# Patient Record
Sex: Female | Born: 1980 | Hispanic: No | Marital: Married | Smoking: Never smoker
Health system: Southern US, Community
[De-identification: ages and names within clinical notes are randomized; demographics above are authoritative.]

## PROBLEM LIST (undated history)

## (undated) DIAGNOSIS — I872 Venous insufficiency (chronic) (peripheral): Secondary | ICD-10-CM

## (undated) DIAGNOSIS — Z8614 Personal history of Methicillin resistant Staphylococcus aureus infection: Secondary | ICD-10-CM

## (undated) DIAGNOSIS — I8393 Asymptomatic varicose veins of bilateral lower extremities: Secondary | ICD-10-CM

## (undated) DIAGNOSIS — L639 Alopecia areata, unspecified: Secondary | ICD-10-CM

## (undated) DIAGNOSIS — Z789 Other specified health status: Secondary | ICD-10-CM

## (undated) HISTORY — DX: Personal history of Methicillin resistant Staphylococcus aureus infection: Z86.14

## (undated) HISTORY — PX: NO PAST SURGERIES: SHX2092

## (undated) HISTORY — DX: Other specified health status: Z78.9

## (undated) HISTORY — DX: Alopecia areata, unspecified: L63.9

## (undated) HISTORY — DX: Venous insufficiency (chronic) (peripheral): I87.2

## (undated) HISTORY — PX: DILATION AND CURETTAGE OF UTERUS: SHX78

## (undated) HISTORY — DX: Asymptomatic varicose veins of bilateral lower extremities: I83.93

---

## 2011-10-26 NOTE — L&D Delivery Note (Signed)
Delivery Note Excellent pushing effort noted, baby turn to OA after she labored in Knee chest position. Pudendal block with 20 cc Lidocaine 1% given. Tight vaginal band noted, hence left mediolateral episiotomy given. At 1:56 PM  viable and healthy FEMALE infant was delivered via Vaginal, Spontaneous Delivery (Presentation: Left Occiput Anterior).  APGAR: 9, 9; weight -pending Placenta status: Intact, Spontaneous.  Cord: 2 vessels with the following complications: None.  Cord pH: not neeed Anesthesia: Pudendal  Episiotomy: Left Mediolateral Lacerations: None Suture Repair: 3.0 vicryl rapide Est. Blood Loss (mL): 350  Mom to postpartum.  Baby to with mother.  Meral Geissinger R 08/18/2012, 2:56 PM

## 2012-01-11 LAB — OB RESULTS CONSOLE HEPATITIS B SURFACE ANTIGEN: Hepatitis B Surface Ag: NEGATIVE

## 2012-01-11 LAB — OB RESULTS CONSOLE RUBELLA ANTIBODY, IGM: Rubella: IMMUNE

## 2012-01-11 LAB — OB RESULTS CONSOLE ANTIBODY SCREEN: Antibody Screen: NEGATIVE

## 2012-01-11 LAB — OB RESULTS CONSOLE HIV ANTIBODY (ROUTINE TESTING): HIV: NONREACTIVE

## 2012-01-11 LAB — OB RESULTS CONSOLE RPR: RPR: NONREACTIVE

## 2012-01-11 LAB — OB RESULTS CONSOLE ABO/RH: RH Type: POSITIVE

## 2012-01-24 LAB — OB RESULTS CONSOLE GC/CHLAMYDIA
Chlamydia: NEGATIVE
Gonorrhea: NEGATIVE

## 2012-07-28 LAB — OB RESULTS CONSOLE GBS: GBS: NEGATIVE

## 2012-08-18 ENCOUNTER — Inpatient Hospital Stay (HOSPITAL_COMMUNITY)
Admission: AD | Admit: 2012-08-18 | Discharge: 2012-08-20 | DRG: 373 | Disposition: A | Discharge status: Home / Self Care | Payer: BC Managed Care – PPO | Source: Ambulatory Visit | Attending: Obstetrics and Gynecology | Admitting: Obstetrics and Gynecology

## 2012-08-18 ENCOUNTER — Encounter (HOSPITAL_COMMUNITY): Payer: Self-pay | Admitting: *Deleted

## 2012-08-18 LAB — CBC
HCT: 42.6 % (ref 36.0–46.0)
Hemoglobin: 14.3 g/dL (ref 12.0–15.0)
MCH: 30.6 pg (ref 26.0–34.0)
MCHC: 33.6 g/dL (ref 30.0–36.0)
MCV: 91.2 fL (ref 78.0–100.0)
Platelets: 148 10*3/uL — ABNORMAL LOW (ref 150–400)
RBC: 4.67 MIL/uL (ref 3.87–5.11)
RDW: 13.6 % (ref 11.5–15.5)
WBC: 10 10*3/uL (ref 4.0–10.5)

## 2012-08-18 LAB — TYPE AND SCREEN
ABO/RH(D): O POS
Antibody Screen: NEGATIVE

## 2012-08-18 LAB — ABO/RH: ABO/RH(D): O POS

## 2012-08-18 LAB — RPR: RPR Ser Ql: NONREACTIVE

## 2012-08-18 MED ORDER — OXYTOCIN BOLUS FROM INFUSION
500.0000 mL | INTRAVENOUS | Status: DC
  Administered 2012-08-18: 500 mL via INTRAVENOUS
  Filled 2012-08-18 (×64): qty 500

## 2012-08-18 MED ORDER — ONDANSETRON HCL 4 MG/2ML IJ SOLN: 4.0000 mg | Freq: Four times a day (QID) | INTRAMUSCULAR | Status: DC | PRN

## 2012-08-18 MED ORDER — PHENYLEPHRINE 40 MCG/ML (10ML) SYRINGE FOR IV PUSH (FOR BLOOD PRESSURE SUPPORT): 80.0000 ug | PREFILLED_SYRINGE | INTRAVENOUS | Status: DC | PRN

## 2012-08-18 MED ORDER — DIPHENHYDRAMINE HCL 25 MG PO CAPS: 25.0000 mg | ORAL_CAPSULE | Freq: Four times a day (QID) | ORAL | Status: DC | PRN

## 2012-08-18 MED ORDER — IBUPROFEN 600 MG PO TABS
600.0000 mg | ORAL_TABLET | Freq: Four times a day (QID) | ORAL | Status: DC | PRN
  Filled 2012-08-18: qty 1

## 2012-08-18 MED ORDER — DIBUCAINE 1 % RE OINT: 1.0000 "application " | TOPICAL_OINTMENT | RECTAL | Status: DC | PRN

## 2012-08-18 MED ORDER — LIDOCAINE HCL (PF) 1 % IJ SOLN
30.0000 mL | INTRAMUSCULAR | Status: DC | PRN
  Administered 2012-08-18: 30 mL via SUBCUTANEOUS
  Filled 2012-08-18: qty 30

## 2012-08-18 MED ORDER — SENNOSIDES-DOCUSATE SODIUM 8.6-50 MG PO TABS
2.0000 | ORAL_TABLET | Freq: Every day | ORAL | Status: DC
  Administered 2012-08-18 – 2012-08-19 (×2): 2 via ORAL

## 2012-08-18 MED ORDER — LACTATED RINGERS IV SOLN: 500.0000 mL | INTRAVENOUS | Status: DC | PRN

## 2012-08-18 MED ORDER — LACTATED RINGERS IV SOLN: 500.0000 mL | Freq: Once | INTRAVENOUS | Status: DC

## 2012-08-18 MED ORDER — BENZOCAINE-MENTHOL 20-0.5 % EX AERO
1.0000 "application " | INHALATION_SPRAY | CUTANEOUS | Status: DC | PRN
  Administered 2012-08-19: 1 via TOPICAL
  Filled 2012-08-18: qty 56

## 2012-08-18 MED ORDER — FLEET ENEMA 7-19 GM/118ML RE ENEM: 1.0000 | ENEMA | RECTAL | Status: DC | PRN

## 2012-08-18 MED ORDER — FENTANYL 2.5 MCG/ML BUPIVACAINE 1/10 % EPIDURAL INFUSION (WH - ANES): 14.0000 mL/h | INTRAMUSCULAR | Status: DC

## 2012-08-18 MED ORDER — ONDANSETRON HCL 4 MG/2ML IJ SOLN: 4.0000 mg | INTRAMUSCULAR | Status: DC | PRN

## 2012-08-18 MED ORDER — SIMETHICONE 80 MG PO CHEW: 80.0000 mg | CHEWABLE_TABLET | ORAL | Status: DC | PRN

## 2012-08-18 MED ORDER — ACETAMINOPHEN 325 MG PO TABS: 650.0000 mg | ORAL_TABLET | ORAL | Status: DC | PRN

## 2012-08-18 MED ORDER — OXYCODONE-ACETAMINOPHEN 5-325 MG PO TABS
1.0000 | ORAL_TABLET | ORAL | Status: DC | PRN
  Administered 2012-08-18 – 2012-08-19 (×2): 1 via ORAL
  Filled 2012-08-18 (×2): qty 1

## 2012-08-18 MED ORDER — LACTATED RINGERS IV SOLN
INTRAVENOUS | Status: DC
  Administered 2012-08-18: 1000 mL via INTRAVENOUS

## 2012-08-18 MED ORDER — TETANUS-DIPHTH-ACELL PERTUSSIS 5-2.5-18.5 LF-MCG/0.5 IM SUSP: 0.5000 mL | Freq: Once | INTRAMUSCULAR | Status: DC

## 2012-08-18 MED ORDER — OXYCODONE-ACETAMINOPHEN 5-325 MG PO TABS: 1.0000 | ORAL_TABLET | ORAL | Status: DC | PRN

## 2012-08-18 MED ORDER — WITCH HAZEL-GLYCERIN EX PADS: 1.0000 "application " | MEDICATED_PAD | CUTANEOUS | Status: DC | PRN

## 2012-08-18 MED ORDER — CITRIC ACID-SODIUM CITRATE 334-500 MG/5ML PO SOLN: 30.0000 mL | ORAL | Status: DC | PRN

## 2012-08-18 MED ORDER — EPHEDRINE 5 MG/ML INJ: 10.0000 mg | INTRAVENOUS | Status: DC | PRN

## 2012-08-18 MED ORDER — ZOLPIDEM TARTRATE 5 MG PO TABS: 5.0000 mg | ORAL_TABLET | Freq: Every evening | ORAL | Status: DC | PRN

## 2012-08-18 MED ORDER — DIPHENHYDRAMINE HCL 50 MG/ML IJ SOLN: 12.5000 mg | INTRAMUSCULAR | Status: DC | PRN

## 2012-08-18 MED ORDER — BUTORPHANOL TARTRATE 1 MG/ML IJ SOLN
1.0000 mg | INTRAMUSCULAR | Status: DC | PRN
  Administered 2012-08-18 (×2): 1 mg via INTRAVENOUS
  Filled 2012-08-18 (×2): qty 1

## 2012-08-18 MED ORDER — PRENATAL MULTIVITAMIN CH
1.0000 | ORAL_TABLET | Freq: Every day | ORAL | Status: DC
  Administered 2012-08-19: 1 via ORAL
  Filled 2012-08-18: qty 1

## 2012-08-18 MED ORDER — OXYTOCIN 40 UNITS IN LACTATED RINGERS INFUSION - SIMPLE MED
62.5000 mL/h | INTRAVENOUS | Status: DC
  Filled 2012-08-18: qty 1000

## 2012-08-18 MED ORDER — IBUPROFEN 600 MG PO TABS
600.0000 mg | ORAL_TABLET | Freq: Four times a day (QID) | ORAL | Status: DC
  Administered 2012-08-18 – 2012-08-19 (×4): 600 mg via ORAL
  Filled 2012-08-18 (×5): qty 1

## 2012-08-18 MED ORDER — ONDANSETRON HCL 4 MG PO TABS: 4.0000 mg | ORAL_TABLET | ORAL | Status: DC | PRN

## 2012-08-18 MED ORDER — LANOLIN HYDROUS EX OINT: TOPICAL_OINTMENT | CUTANEOUS | Status: DC | PRN

## 2012-08-18 NOTE — Progress Notes (Signed)
Samantha Trevino is a 31 y.o. G1P0 at [redacted]w[redacted]d by LMP admitted for active labor  Subjective: Contractions  Objective: BP 134/81  Pulse 82  Temp 98.2 F (36.8 C) (Oral)  Resp 18  Ht 5\' 3"  (1.6 m)  Wt 64.184 kg (141 lb 8 oz)  BMI 25.07 kg/m2  SpO2 100%      FHT:  FHR: 155 bpm, variability: moderate,  accelerations:  Present,  decelerations:  Absent UC:   regular, every 2-4 minutes SVE:   Dilation: 4.5 Effacement (%): 80 Station: -3 Exam by:: M.Topp,RN  Labs: Lab Results  Component Value Date   WBC 10.0 08/18/2012   HGB 14.3 08/18/2012   HCT 42.6 08/18/2012   MCV 91.2 08/18/2012   PLT 148* 08/18/2012    Assessment / Plan: Spontaneous labor, progressing normally  Labor: Progressing normally Preeclampsia:  na Fetal Wellbeing:  Category I Pain Control:  Labor support without medications I/D:  n/a Anticipated MOD:  NSVD  Page Pucciarelli J 08/18/2012, 7:37 AM

## 2012-08-18 NOTE — MAU Note (Signed)
Pt states started leaking clear fluid at 0130

## 2012-08-18 NOTE — H&P (Signed)
Samantha Trevino, Samantha Trevino                ACCOUNT NO.:  [id number]  MEDICAL RECORD NO.:  [id number]  LOCATION:  9167                          FACILITY:  [facility]  PHYSICIAN:  Lenoard Aden, M.D.DATE OF BIRTH:  January 29, 1981  DATE OF ADMISSION:  08/18/2012 DATE OF DISCHARGE:                             HISTORY & PHYSICAL   CHIEF COMPLAINT:  Labor.  She is a 32 year old [place] female, G1, P0 at 34 and 5/7 weeks who presented with questionable leakage of fluid and contractions with cervical change.  ALLERGIES:  She has no known drug allergies.  MEDICATIONS:  Prenatal vitamins.  SOCIAL HISTORY:  She denies being a nonsmoker, nondrinker.  She denies domestic or physical violence.  FAMILY HISTORY:  Family history of heart disease, sleep disorder __________ pregnancy.  PHYSICAL EXAMINATION:  GENERAL:  This is a well-developed, well- nourished [place] female, in no acute distress. HEENT:  Normal. NECK:  Supple.  Full range of motion. LUNGS:  Clear. HEART:  Regular rhythm. ABDOMEN:  Soft, gravid, nontender.  Estimated fetal weight 7.5 pounds. Cervix per RN is 4-5 cm, 100%, vertex, -2. EXTREMITIES:  There are no cords. NEUROLOGIC:  Nonfocal. SKIN:  Intact.  IMPRESSION:  Term intrauterine pregnancy in active labor.  PLAN:  Admit with precautions of attempts at vaginal delivery.     Lenoard Aden, M.D.    RJT/MEDQ  D:  08/18/2012  T:  08/18/2012  Job:  [id number]

## 2012-08-18 NOTE — MAU Note (Signed)
Dr Billy Coast notified of pt status and FHT tracing-to recheck in one hour for cervical change

## 2012-08-19 ENCOUNTER — Encounter (HOSPITAL_COMMUNITY): Payer: Self-pay | Admitting: *Deleted

## 2012-08-19 LAB — CBC
HCT: 32 % — ABNORMAL LOW (ref 36.0–46.0)
Hemoglobin: 11 g/dL — ABNORMAL LOW (ref 12.0–15.0)
MCH: 31.2 pg (ref 26.0–34.0)
MCHC: 34.4 g/dL (ref 30.0–36.0)
MCV: 90.7 fL (ref 78.0–100.0)
Platelets: 133 10*3/uL — ABNORMAL LOW (ref 150–400)
RBC: 3.53 MIL/uL — ABNORMAL LOW (ref 3.87–5.11)
RDW: 13.4 % (ref 11.5–15.5)
WBC: 13 10*3/uL — ABNORMAL HIGH (ref 4.0–10.5)

## 2012-08-19 MED ORDER — ACETAMINOPHEN 325 MG PO TABS
325.0000 mg | ORAL_TABLET | ORAL | Status: DC | PRN
  Administered 2012-08-20: 650 mg via ORAL
  Filled 2012-08-19: qty 2

## 2012-08-19 NOTE — Progress Notes (Signed)
PPD 1 SVD  S:  Reports feeling well - tired             Tolerating po/ No nausea or vomiting             Bleeding is light             Pain controlled with motrin only             Up ad lib / ambulatory  Newborn breast feeding  - no colostrum yet/ Circumcision planned tomorow   O:               VS: BP 106/71  Pulse 90  Temp 97.7 F (36.5 C) (Oral)  Resp 16  Ht 5\' 3"  (1.6 m)  Wt 64.184 kg (141 lb 8 oz)  BMI 25.07 kg/m2  SpO2 99%  Breastfeeding? Unknown   LABS:  Basename 08/19/12 0550 08/18/12 0525  WBC 13.0* 10.0  HGB 11.0* 14.3  PLT 133* 148*                                          I&O:                         I/O last 3 completed shifts: In: -  Out: 350 [Blood:350]               Physical Exam:             Alert and oriented X3  Lungs: Clear and unlabored  Heart: regular rate and rhythm / no mumurs  Abdomen: soft, non-tender, non-distended              Fundus: firm, non-tender, U-2  Perineum: no edema  Lochia: light  Extremities: no edema, no calf pain or tenderness    A: PPD # 1   Doing well - stable status  P:  Routine post partum orders  Discharge tomorrow - work with lactation today.              Recommend push water hydration / get sleep / start pumping to stimulate between feedings                                       Attempt breastfeed newborn every 2-4 hours  Marlinda Mike CNM, MSN 08/19/2012, 12:16 PM

## 2012-08-20 MED ORDER — PRENATAL 27-0.8 MG PO TABS: 1.0000 | ORAL_TABLET | Freq: Every day | ORAL | Status: DC

## 2012-08-20 MED ORDER — IBUPROFEN 600 MG PO TABS: 600.0000 mg | ORAL_TABLET | Freq: Four times a day (QID) | ORAL | Status: DC

## 2012-08-20 NOTE — Discharge Summary (Signed)
Obstetric Discharge Summary  Reason for Admission: onset of labor Prenatal Procedures: none Intrapartum Procedures: spontaneous vaginal delivery and episiotomy mediolateral 2nd Postpartum Procedures: none Complications-Operative and Postpartum: none Hemoglobin  Date Value Range Status  08/19/2012 11.0* 12.0 - 15.0 g/dL Final     REPEATED TO VERIFY     DELTA CHECK NOTED     HCT  Date Value Range Status  08/19/2012 32.0* 36.0 - 46.0 % Final    Physical Exam:  General: alert, cooperative and no distress Lochia: appropriate Uterine Fundus: firm Incision: healing well DVT Evaluation: No evidence of DVT seen on physical exam.  Discharge Diagnoses: Term Pregnancy-delivered  Discharge Information: Date: 08/20/2012 Activity: pelvic rest Diet: routine Medications: PNV and Ibuprofen Condition: stable Instructions: refer to practice specific booklet Discharge to: home Follow-up Information    Follow up with Lenoard Aden, MD. Schedule an appointment as soon as possible for a visit in 6 weeks.   Contact information:   [address] [place] [address] [phone]          Newborn Data: Live born female  Birth Weight: 7 lb 4 oz (3289 g) APGAR: 9, 9  Home with mother.  Samantha Trevino 08/20/2012, 7:28 AM

## 2012-08-20 NOTE — Discharge Summary (Signed)
Reviewed and agree with note and plan. V.Riyanna Crutchley, MD  

## 2012-08-20 NOTE — Progress Notes (Signed)
PPD 2 SVD  S:  Reports feeling well             Tolerating po/ No nausea or vomiting             Bleeding is light             Pain controlled with motrin             Up ad lib / ambulatory  Newborn breast & bottle feeding  / lactation consult yesterday - will see again today prior to discharge   O:               VS: BP 111/76  Pulse 88  Temp 97.9 F (36.6 C) (Oral)  Resp 18  Ht 5\' 3"  (1.6 m)  Wt 64.184 kg (141 lb 8 oz)  BMI 25.07 kg/m2  SpO2 99%  Breastfeeding? Unknown   LABS:  Basename 08/19/12 0550 08/18/12 0525  WBC 13.0* 10.0  HGB 11.0* 14.3  PLT 133* 148*                Physical Exam:             Alert and oriented X3  Lungs: Clear and unlabored  Heart: regular rate   Abdomen: soft, non-tender, non-distended              Fundus: firm, non-tender, U-2  Perineum: no edema  Lochia: light  Extremities: no edema, no calf pain or tenderness    A: PPD # 2   Doing well - stable status  P:  Routine post partum orders  Discharge home  Marlinda Mike CNM, MSN 08/20/2012, 7:14 AM

## 2012-09-05 NOTE — Progress Notes (Signed)
Post-discharge UR chart review completed. 

## 2014-08-26 ENCOUNTER — Encounter (HOSPITAL_COMMUNITY): Payer: Self-pay | Admitting: *Deleted

## 2017-06-16 DIAGNOSIS — I8311 Varicose veins of right lower extremity with inflammation: Secondary | ICD-10-CM | POA: Diagnosis not present

## 2017-06-16 DIAGNOSIS — I8312 Varicose veins of left lower extremity with inflammation: Secondary | ICD-10-CM | POA: Diagnosis not present

## 2017-06-16 DIAGNOSIS — I83893 Varicose veins of bilateral lower extremities with other complications: Secondary | ICD-10-CM | POA: Diagnosis not present

## 2017-06-28 DIAGNOSIS — I8311 Varicose veins of right lower extremity with inflammation: Secondary | ICD-10-CM | POA: Diagnosis not present

## 2017-06-28 DIAGNOSIS — I8312 Varicose veins of left lower extremity with inflammation: Secondary | ICD-10-CM | POA: Diagnosis not present

## 2017-07-11 DIAGNOSIS — I8312 Varicose veins of left lower extremity with inflammation: Secondary | ICD-10-CM | POA: Diagnosis not present

## 2017-07-11 DIAGNOSIS — I8311 Varicose veins of right lower extremity with inflammation: Secondary | ICD-10-CM | POA: Diagnosis not present

## 2017-07-11 DIAGNOSIS — I83893 Varicose veins of bilateral lower extremities with other complications: Secondary | ICD-10-CM | POA: Diagnosis not present

## 2017-08-15 DIAGNOSIS — J209 Acute bronchitis, unspecified: Secondary | ICD-10-CM | POA: Diagnosis not present

## 2017-08-15 DIAGNOSIS — R509 Fever, unspecified: Secondary | ICD-10-CM | POA: Diagnosis not present

## 2017-08-29 DIAGNOSIS — R05 Cough: Secondary | ICD-10-CM | POA: Diagnosis not present

## 2017-11-01 DIAGNOSIS — I8312 Varicose veins of left lower extremity with inflammation: Secondary | ICD-10-CM | POA: Diagnosis not present

## 2017-11-04 DIAGNOSIS — I8312 Varicose veins of left lower extremity with inflammation: Secondary | ICD-10-CM | POA: Diagnosis not present

## 2017-11-04 DIAGNOSIS — I83892 Varicose veins of left lower extremities with other complications: Secondary | ICD-10-CM | POA: Diagnosis not present

## 2017-11-18 DIAGNOSIS — I83892 Varicose veins of left lower extremities with other complications: Secondary | ICD-10-CM | POA: Diagnosis not present

## 2017-11-18 DIAGNOSIS — M7981 Nontraumatic hematoma of soft tissue: Secondary | ICD-10-CM | POA: Diagnosis not present

## 2017-11-18 DIAGNOSIS — I8312 Varicose veins of left lower extremity with inflammation: Secondary | ICD-10-CM | POA: Diagnosis not present

## 2017-12-01 DIAGNOSIS — M7981 Nontraumatic hematoma of soft tissue: Secondary | ICD-10-CM | POA: Diagnosis not present

## 2017-12-01 DIAGNOSIS — I8312 Varicose veins of left lower extremity with inflammation: Secondary | ICD-10-CM | POA: Diagnosis not present

## 2017-12-28 DIAGNOSIS — I8311 Varicose veins of right lower extremity with inflammation: Secondary | ICD-10-CM | POA: Diagnosis not present

## 2017-12-30 DIAGNOSIS — I8311 Varicose veins of right lower extremity with inflammation: Secondary | ICD-10-CM | POA: Diagnosis not present

## 2018-01-02 DIAGNOSIS — I8311 Varicose veins of right lower extremity with inflammation: Secondary | ICD-10-CM | POA: Diagnosis not present

## 2018-03-03 DIAGNOSIS — I8311 Varicose veins of right lower extremity with inflammation: Secondary | ICD-10-CM | POA: Diagnosis not present

## 2018-03-03 DIAGNOSIS — I83811 Varicose veins of right lower extremities with pain: Secondary | ICD-10-CM | POA: Diagnosis not present

## 2018-03-07 DIAGNOSIS — I8312 Varicose veins of left lower extremity with inflammation: Secondary | ICD-10-CM | POA: Diagnosis not present

## 2018-03-14 DIAGNOSIS — I8312 Varicose veins of left lower extremity with inflammation: Secondary | ICD-10-CM | POA: Diagnosis not present

## 2018-03-14 DIAGNOSIS — I83812 Varicose veins of left lower extremities with pain: Secondary | ICD-10-CM | POA: Diagnosis not present

## 2018-03-16 DIAGNOSIS — I8312 Varicose veins of left lower extremity with inflammation: Secondary | ICD-10-CM | POA: Diagnosis not present

## 2018-03-24 DIAGNOSIS — I8312 Varicose veins of left lower extremity with inflammation: Secondary | ICD-10-CM | POA: Diagnosis not present

## 2018-07-04 DIAGNOSIS — Z01419 Encounter for gynecological examination (general) (routine) without abnormal findings: Secondary | ICD-10-CM | POA: Diagnosis not present

## 2018-07-04 DIAGNOSIS — Z681 Body mass index (BMI) 19 or less, adult: Secondary | ICD-10-CM | POA: Diagnosis not present

## 2018-08-03 DIAGNOSIS — Z3201 Encounter for pregnancy test, result positive: Secondary | ICD-10-CM | POA: Diagnosis not present

## 2018-08-21 DIAGNOSIS — Z3201 Encounter for pregnancy test, result positive: Secondary | ICD-10-CM | POA: Diagnosis not present

## 2018-08-29 DIAGNOSIS — O021 Missed abortion: Secondary | ICD-10-CM | POA: Diagnosis not present

## 2018-09-01 DIAGNOSIS — O021 Missed abortion: Secondary | ICD-10-CM | POA: Diagnosis not present

## 2018-10-11 DIAGNOSIS — Z862 Personal history of diseases of the blood and blood-forming organs and certain disorders involving the immune mechanism: Secondary | ICD-10-CM | POA: Diagnosis not present

## 2018-10-11 DIAGNOSIS — L659 Nonscarring hair loss, unspecified: Secondary | ICD-10-CM | POA: Diagnosis not present

## 2018-10-11 DIAGNOSIS — E559 Vitamin D deficiency, unspecified: Secondary | ICD-10-CM | POA: Diagnosis not present

## 2018-10-11 DIAGNOSIS — Z0000 Encounter for general adult medical examination without abnormal findings: Secondary | ICD-10-CM | POA: Diagnosis not present

## 2018-11-07 DIAGNOSIS — L638 Other alopecia areata: Secondary | ICD-10-CM | POA: Diagnosis not present

## 2018-12-14 DIAGNOSIS — L638 Other alopecia areata: Secondary | ICD-10-CM | POA: Diagnosis not present

## 2019-01-09 DIAGNOSIS — L638 Other alopecia areata: Secondary | ICD-10-CM | POA: Diagnosis not present

## 2019-02-19 DIAGNOSIS — L638 Other alopecia areata: Secondary | ICD-10-CM | POA: Diagnosis not present

## 2019-03-27 DIAGNOSIS — L638 Other alopecia areata: Secondary | ICD-10-CM | POA: Diagnosis not present

## 2019-05-22 DIAGNOSIS — L811 Chloasma: Secondary | ICD-10-CM | POA: Diagnosis not present

## 2019-05-22 DIAGNOSIS — L638 Other alopecia areata: Secondary | ICD-10-CM | POA: Diagnosis not present

## 2019-07-17 DIAGNOSIS — Z681 Body mass index (BMI) 19 or less, adult: Secondary | ICD-10-CM | POA: Diagnosis not present

## 2019-07-17 DIAGNOSIS — L638 Other alopecia areata: Secondary | ICD-10-CM | POA: Diagnosis not present

## 2019-07-17 DIAGNOSIS — R87610 Atypical squamous cells of undetermined significance on cytologic smear of cervix (ASC-US): Secondary | ICD-10-CM | POA: Diagnosis not present

## 2019-07-17 DIAGNOSIS — Z01419 Encounter for gynecological examination (general) (routine) without abnormal findings: Secondary | ICD-10-CM | POA: Diagnosis not present

## 2019-07-17 DIAGNOSIS — L659 Nonscarring hair loss, unspecified: Secondary | ICD-10-CM | POA: Diagnosis not present

## 2019-10-27 DIAGNOSIS — Z20828 Contact with and (suspected) exposure to other viral communicable diseases: Secondary | ICD-10-CM | POA: Diagnosis not present

## 2019-10-27 DIAGNOSIS — Z03818 Encounter for observation for suspected exposure to other biological agents ruled out: Secondary | ICD-10-CM | POA: Diagnosis not present

## 2019-11-02 DIAGNOSIS — Z20828 Contact with and (suspected) exposure to other viral communicable diseases: Secondary | ICD-10-CM | POA: Diagnosis not present

## 2019-11-04 DIAGNOSIS — Z20828 Contact with and (suspected) exposure to other viral communicable diseases: Secondary | ICD-10-CM | POA: Diagnosis not present

## 2019-11-09 DIAGNOSIS — Z20828 Contact with and (suspected) exposure to other viral communicable diseases: Secondary | ICD-10-CM | POA: Diagnosis not present

## 2019-11-15 DIAGNOSIS — Z20828 Contact with and (suspected) exposure to other viral communicable diseases: Secondary | ICD-10-CM | POA: Diagnosis not present

## 2019-11-23 DIAGNOSIS — Z20828 Contact with and (suspected) exposure to other viral communicable diseases: Secondary | ICD-10-CM | POA: Diagnosis not present

## 2019-11-30 DIAGNOSIS — Z20828 Contact with and (suspected) exposure to other viral communicable diseases: Secondary | ICD-10-CM | POA: Diagnosis not present

## 2019-12-06 DIAGNOSIS — Z20828 Contact with and (suspected) exposure to other viral communicable diseases: Secondary | ICD-10-CM | POA: Diagnosis not present

## 2019-12-11 DIAGNOSIS — L811 Chloasma: Secondary | ICD-10-CM | POA: Diagnosis not present

## 2019-12-11 DIAGNOSIS — L638 Other alopecia areata: Secondary | ICD-10-CM | POA: Diagnosis not present

## 2019-12-18 DIAGNOSIS — Z20828 Contact with and (suspected) exposure to other viral communicable diseases: Secondary | ICD-10-CM | POA: Diagnosis not present

## 2019-12-27 DIAGNOSIS — Z3009 Encounter for other general counseling and advice on contraception: Secondary | ICD-10-CM | POA: Diagnosis not present

## 2020-01-11 DIAGNOSIS — Z3009 Encounter for other general counseling and advice on contraception: Secondary | ICD-10-CM | POA: Diagnosis not present

## 2020-02-08 DIAGNOSIS — Z3009 Encounter for other general counseling and advice on contraception: Secondary | ICD-10-CM | POA: Diagnosis not present

## 2020-02-25 DIAGNOSIS — H5213 Myopia, bilateral: Secondary | ICD-10-CM | POA: Diagnosis not present

## 2020-02-29 DIAGNOSIS — N979 Female infertility, unspecified: Secondary | ICD-10-CM | POA: Diagnosis not present

## 2020-03-17 DIAGNOSIS — Z0100 Encounter for examination of eyes and vision without abnormal findings: Secondary | ICD-10-CM | POA: Diagnosis not present

## 2020-03-31 DIAGNOSIS — N979 Female infertility, unspecified: Secondary | ICD-10-CM | POA: Diagnosis not present

## 2020-05-26 DIAGNOSIS — O0901 Supervision of pregnancy with history of infertility, first trimester: Secondary | ICD-10-CM | POA: Diagnosis not present

## 2020-05-26 DIAGNOSIS — Z3200 Encounter for pregnancy test, result unknown: Secondary | ICD-10-CM | POA: Diagnosis not present

## 2020-05-26 DIAGNOSIS — Z3689 Encounter for other specified antenatal screening: Secondary | ICD-10-CM | POA: Diagnosis not present

## 2020-05-26 LAB — OB RESULTS CONSOLE ABO/RH: RH Type: POSITIVE

## 2020-05-26 LAB — OB RESULTS CONSOLE RPR: RPR: NONREACTIVE

## 2020-05-26 LAB — OB RESULTS CONSOLE HEPATITIS B SURFACE ANTIGEN: Hepatitis B Surface Ag: NEGATIVE

## 2020-05-26 LAB — OB RESULTS CONSOLE RUBELLA ANTIBODY, IGM: Rubella: IMMUNE

## 2020-05-26 LAB — OB RESULTS CONSOLE ANTIBODY SCREEN: Antibody Screen: NEGATIVE

## 2020-05-26 LAB — OB RESULTS CONSOLE HIV ANTIBODY (ROUTINE TESTING): HIV: NONREACTIVE

## 2020-06-09 DIAGNOSIS — Z3201 Encounter for pregnancy test, result positive: Secondary | ICD-10-CM | POA: Diagnosis not present

## 2020-06-26 ENCOUNTER — Encounter: Payer: Self-pay | Admitting: *Deleted

## 2020-07-01 ENCOUNTER — Other Ambulatory Visit: Payer: Self-pay

## 2020-07-01 ENCOUNTER — Ambulatory Visit
Payer: No Typology Code available for payment source | Attending: Obstetrics and Gynecology | Admitting: Genetic Counselor

## 2020-07-01 ENCOUNTER — Ambulatory Visit: Payer: Self-pay | Admitting: Genetic Counselor

## 2020-07-01 DIAGNOSIS — Z315 Encounter for genetic counseling: Secondary | ICD-10-CM

## 2020-07-01 DIAGNOSIS — O09521 Supervision of elderly multigravida, first trimester: Secondary | ICD-10-CM | POA: Diagnosis not present

## 2020-07-01 DIAGNOSIS — Z8759 Personal history of other complications of pregnancy, childbirth and the puerperium: Secondary | ICD-10-CM | POA: Diagnosis not present

## 2020-07-01 DIAGNOSIS — Z3A10 10 weeks gestation of pregnancy: Secondary | ICD-10-CM | POA: Diagnosis not present

## 2020-07-01 NOTE — Progress Notes (Signed)
07/01/2020  Kayte Borchard 06/20/1981 MRN: [id number] DOV: 07/01/2020  Ms. Lahti presented to the [facility] for a genetics consultation regarding advanced maternal age. Ms. Pieratt came to her appointment alone.   Indication for genetic counseling - Advanced maternal age  Prenatal history  Ms. Lecount is a [id number], 39 y.o. female. Her current pregnancy has completed [redacted]w[redacted]d (Estimated Date of Delivery: 01/23/21). Ms. Sliva and her husband have a 65 year old son. Ms. Ertle also had a miscarriage at around 8-9 weeks' gestation within the past couple of years. The current pregnancy was a natural conception after several failed IUI cycles.   Ms. Woodin denied exposure to environmental toxins or chemical agents. She denied the use of alcohol, tobacco or street drugs. She reported taking prenatal vitamins. She denied significant viral illnesses, fevers, and bleeding during the course of her pregnancy. Her medical and surgical histories were noncontributory.  Family History  A three generation pedigree was drafted and reviewed. The family history is remarkable for the following:  - Ms. Hass had two maternal aunts who reportedly died at age 77-5 of an unknown cause. Given that there is limited information about these individuals, risk assessment is limited.  - Ms. Blankenburg's husband, Cayman Brogden, had seizures until the age of 5. Ms. Villalona informed me that it is thought that her husband was deprived of oxygen during delivery which may have contributed to his seizures. He has not had any seizures since childhood. We discussed that seizures can occur secondary to a variety of environmental, lifestyle, and genetic factors. When epilepsy does not have an identified genetic cause, the chance that a child of an affected parent will also have or develop epilepsy is approximately 4%. However, without knowing the precise etiology of Mr. Yohn seizures, risk assessment is limited.  - Mr. Totton  has a maternal uncle who died in his 47s of a heart attack. We discussed that many forms of heart disease are multifactorial in nature, occurring due to a combination of genetic, lifestyle, and environmental factors. Known risk factors for having a heart attack at a young age include substance abuse, smoking, hypertension, hypercholesterolemia, lack of physical activity, diabetes, and poor diet. However, some families appear to have a strong family history of heart disease, and certain genetic conditions can be associated with heart attacks at young ages. Given that no one else in Mr. Ladouceur maternal side of the family has a history of heart disease or heart attacks, Mr. Kot uncle's heart attack was likely multifactorial in nature. For this reason, precise recurrence risk for Mr. Broy and his children cannot be determined.   - Mr. Kaneko has a paternal aunt whose son is paralyzed from the waist down. Ms. Molina was unsure if this was congenital or what the cause of his paralysis is. For this reason, risk assessment is limited.  The remaining family histories were reviewed and found to be noncontributory for birth defects, intellectual disability, recurrent pregnancy loss, and known genetic conditions.    The patient's ethnicity is [place]. The father of the pregnancy's ethnicity is [place]. Ashkenazi Jewish ancestry and consanguinity were denied. Pedigree will be scanned under Media.  Discussion  Advanced maternal age:  Ms. Hulce was referred to genetic counseling for advanced maternal age, as she will be 39 years old at the time of delivery. We discussed that as a woman ages, the risk for certain chromosomal conditions, such as trisomy 41 (Down syndrome), trisomy 45, and trisomy 18 increases.  These conditions often are not inherited, but instead occur due to an error in chromosomal division during the formation of sperm and egg cells in a process called nondisjunction. At her age and during the first  trimester, Ms. Allena Katzatel has approximately a 1 in 5666 (1.5%) chance of having a child with a chromosomal aneuploidy. Her age-related risk to have a child with Down syndrome specifically is 1 in 132 (0.8%) in the first trimester. We briefly reviewed features associated with Down syndrome, trisomy 7313, and trisomy 2318.    We reviewed noninvasive prenatal screening (NIPS) as an available screening option for chromosomal aneuploidies. Specifically, we discussed that NIPS analyzes cell free DNA originating from the placenta that is found in the maternal blood circulation during pregnancy. This test is not diagnostic for chromosome conditions, but can provide information regarding the presence or absence of extra fetal DNA for chromosomes 13, 18 21, and the sex chromosomes. Thus, it would not identify or rule out all fetal aneuploidy. The reported detection rate is 91-99% for trisomies 21, 18, 13, and sex chromosome aneuploidies. The false positive rate is reported to be less than 0.1% for any of these conditions.  Ms. Allena Katzatel expressed that she was interested in seeking more comprehensive information about the pregnancy. We reviewed the option of MaterniT Genome. MaterniT Genome is genome-wide NIPS that provides information about seven clinically relevant microdeletion regions and gains or losses of chromosomal material ? 7 Mb across all chromosomes (rather than a select few). Ms. Allena Katzatel was counseled that she was not at increased risk for smaller chromosomal deletions or duplications based on her age. Like NIPS for chromosomal aneuploidies, MaterniT Genome is not diagnostic. A positive result requires confirmation by amniocentesis, and a negative test result does not rule out all single gene disorders or all chromosomal deletions/duplications.  Advanced paternal age:  Given that Ms. Pena's husband will be 37 years old at the time of delivery, we also reviewed considerations associated with advanced paternal age. An  individual is considered to be of advanced paternal age (APA) if they are over the age of 37 at time of conception. The chance of de novo (new) single gene mutations in an individual's offspring increases with paternal age. We discussed an available screening option for conditions associated with advanced paternal age called Vistara. Vistara utilizes noninvasive prenatal screening (NIPS) to screen a pregnancy for 25 autosomal or X-linked dominant single gene disorders. These include conditions associated with advanced paternal age, such as Noonan syndrome, skeletal dysplasias, and craniosynostoses. Vistara is NOT diagnostic, and a positive result requires confirmation by CVS or amniocentesis. A negative test result does not rule out all single gene disorders.   Carrier screening:  Per ACOG recommendation, carrier screening for hemoglobinopathies, cystic fibrosis (CF) and spinal muscular atrophy (SMA) was discussed including information about the conditions, rationale for testing, autosomal recessive inheritance, and the option of prenatal diagnosis. We also discussed the option of expanded carrier screening (ECS). Expanded carrier screening (ECS) is a testing option that evaluates carrier status for a wider range of genetic conditions. ECS panels can include >200 autosomal recessive or X-linked genetic conditions.   Ms. Allena Katzatel was counseled that if she underwent carrier screening and was found to be a carrier for one or more recessive conditions, carrier screening for those conditions would be recommended for her husband. This could help determine whether the current or future pregnancies with her husband are at increased risk for a particular recessive genetic condition.    Although ECS  is able to identify an individual's carrier status for many genetic disorders, it also has some limitations. We reviewed that ECS could possibly reveal information about Ms. Meetze's own health that she was previously unaware  of. For example, some genes included on ECS panels confer an increased risk for cancer, dementia, and other health problems for carriers. Additionally, some of the conditions included on ECS are rare while others may occur more commonly. Some conditions may be severe and actionable, whereas other conditions may not yet be well-understood or may not have treatment options available. Additionally, a negative result does not indicate that there is no risk for any genetic condition in the couple's children, as ECS does not evaluate for all possible genetic conditions or all possible mutations in the genes included on the panel. Finally, ECS is not diagnostic. Even if one or both parents are found to be carriers for a condition, their children would not be guaranteed to be affected.   Diagnostic testing:  Ms. Mabin was also counseled regarding the option of diagnostic testing via chorionic villus sampling (CVS) at 11-14 weeks or amniocentesis from 16 weeks until the end of pregnancy. We discussed the technical aspects of each procedure and quoted up to a 1 in 500 (0.2%) risk for spontaneous pregnancy loss or other adverse pregnancy outcomes as a result of either procedure. Cultured cells from either a placental or amniotic fluid sample allow for the visualization of a fetal karyotype, which can detect >99% of chromosomal aberrations. Chromosomal microarray can also be performed to identify smaller deletions or duplications of fetal chromosomal material. We discussed that diagnostic testing would be able to detect any abnormalities that NIPS and MaterniT Genome screen for among other smaller chromosomal deletions/duplications. Diagnostic testing could also be performed to assess for single gene conditions if a fetus were known to be at increased risk for a particular disorder.  Pros/cons of testing options:  Ms. Wigington disclosed that she and her husband do not have a lot of social support in the area which would  make it difficult for them to care for a child with special needs. Because of this, Ms. Sonntag is interested in pursuing prenatal testing to have the opportunity to make an informed decision regarding continuation of pregnancy should the current fetus be at increased risk for a genetic condition.   We had an extensive discussion about the pros and cons of the various testing options discussed today. We discussed that some people prefer to begin with NIPS, then may consider undergoing diagnostic testing if NIPS were to come back high risk for a genetic condition. Other people need definitive answers to feel comfortable moving forward with a pregnancy, so they may opt to pursue diagnostic testing immediately. Ms. Josephs informed me that she will likely pursue diagnostic testing.   We reviewed the benefits and limitations of CVS versus amniocentesis. A major benefit of CVS is that it is able to provide diagnostic information earlier, which allows for more pregnancy management options to remain available. Ms. Harral was informed that it is an option to end a pregnancy until 20-22 weeks' gestation in the state of [place] depending on the clinic. Termination is still an option beyond 22 weeks' gestation in other states. A limitation to CVS is the possibility of confined placental mosaicism. CVS assesses placental tissue which should be representative of the fetus given that they are made up of the same sperm and egg cells; however, in 1-2% of cases, confined placental mosaicism may  be of concern. If mosaicism was suspected based on results from CVS, it would be recommended that follow-up with amniocentesis be performed to assess whether a condition is present in the fetus or is confined to the placenta.  We discussed that another limitation of diagnostic testing is that it cannot detect all possible genetic conditions. Standard testing ordered on CVS/amniocentesis samples include fetal chromosomal analyses.  Testing for single gene disorders is often not ordered unless indicated by a family history or particular ultrasound anomalies. If Ms. Fina opted to undergo Vistara and results came back high-risk for a particular single gene disorder, single gene analysis for this condition could be added onto a CVS/amniocentesis sample. Additionally, if Ms. Tennant and her husband opted to undergo ECS and were found to be carriers for the same condition, single gene analysis for that condition could be added onto a CVS/amniocentesis sample.  Finally, we reviewed relevant insurance considerations. Ms. Holstine was informed that her insurance company would like cover NIPS and diagnostic testing given that she is considered advanced maternal age. Insurance also often covers carrier screening for the ACOG-recommended conditions but may not cover ECS. We also discussed that insurance may not cover the cost of [employer] or Vistara. I provided her with the CPT codes for MaterniT Genome so that she can call her insurance company to inquire about coverage for this test. We also discussed self-pay options for ECS ($250 through [employer], with partner reflex testing costing $100) and Vistara ($349).   Plan:  Ms. Buell wished to discuss her options with her husband before making a decision about which testing to pursue. I provided her with several resources, including information sheets on CVS, amniocentesis, and ECS and pamphlets on [employer], [employer], and various carrier screening options from [place]. I also offered to send her an email summarizing our discussion today and her various screening/testing options, which she expressed interest in. Ms. Saltz will contact me once she has had a chance to discuss her options with her husband. I will facilitate sample collection and testing from there.  I counseled Ms. Quist regarding the above risks and available options. The approximate face-to-face time with the genetic counselor  was 60 minutes.  In summary:  Discussed advanced maternal age and advanced paternal age  ~1.5% chance of having child with a chromosomal aneuploidy at age 73  ~1% chance to have child with a de novo (new) single gene condition given advanced paternal age  Discussed carrier screening for ACOG recommended conditions vs. expanded carrier screening  Offered additional testing and screening  Discussed the following screening options:   NIPS for chromosomal aneuploidies  MaterniT Genome  Vistara  Discussed diagnostic testing via CVS or amniocentesis  Will speak with husband about options and contact me once she has made a decision about which test(s) to undergo  Reviewed family history concerns   Gershon Crane, MS, Aeronautical engineer

## 2020-07-07 ENCOUNTER — Other Ambulatory Visit: Payer: Self-pay

## 2020-07-07 ENCOUNTER — Telehealth: Payer: Self-pay | Admitting: Genetic Counselor

## 2020-07-07 DIAGNOSIS — Z3A11 11 weeks gestation of pregnancy: Secondary | ICD-10-CM | POA: Diagnosis not present

## 2020-07-07 DIAGNOSIS — Z3689 Encounter for other specified antenatal screening: Secondary | ICD-10-CM | POA: Diagnosis not present

## 2020-07-07 DIAGNOSIS — O09521 Supervision of elderly multigravida, first trimester: Secondary | ICD-10-CM | POA: Diagnosis not present

## 2020-07-07 LAB — OB RESULTS CONSOLE GC/CHLAMYDIA
Chlamydia: NEGATIVE
Gonorrhea: NEGATIVE

## 2020-07-07 NOTE — Telephone Encounter (Signed)
I received a call from Ms. Beaudry informing me that she had made a decision about which testing options she would like to pursue. She informed me that she would like to proceed with MaterniT Genome and [facility] noninvasive prenatal screening (NIPS); however, she also informed me that she would like to pursue amniocentesis later in the pregnancy. I counseled her that all of the chromosomal conditions that would be detected on MaterniT Genome would be detectable on amniocentesis. Additionally, amniocentesis is diagnostic whereas MaterniT Genome is not. Amniocentesis can also identify smaller chromosomal changes than amniocentesis is able to. If she were to receive a high-risk result from [facility], it would still be recommended that she have a follow-up amniocentesis to confirm this finding. Thus, if Ms. Prisk is going to pursue amniocentesis even if MaterniT Genome were negative, it may be worth it to wait for amniocentesis so that she does not incur another bill. Ms. Siegenthaler understood this and agreed that she would prefer to wait for amniocentesis. We also reviewed that if [facility] NIPS returned high-risk for a particular single gene condition, testing for that condition may also be added to an amniocentesis.   I reminded Ms. Swint that chorionic villus sampling (CVS) is available earlier than amniocentesis. This has the benefit of providing information about the presence or absence of chromosomal abnormalities earlier in pregnancy. Ms. Picone understood the benefits of CVS, but informed me that she would prefer to undergo amniocentesis as close to 16 weeks as possible due to the differences in methodology and the small possibility of confined placental mosaicism associated with CVS. We discussed that results from karyotype and chromosomal microarray analysis on amniocentesis samples are typically available within 3 weeks.  After our discussion today, Ms. Caruthers opted to pursue [facility] NIPS and amniocentesis.  She would like to order [place] through insurance, but is comfortable with the patient-pay price of $349 should out of pocket costs through insurance be more expensive. Given that Panorama NIPS for chromosomal aneuploidies can be combined with Vistara for the same patient-pay price of $349, she would like to combine these screens and pursue Panorama NIPS as well as Vistara. Ms. Freeman will return for a lab appointment here in [facility] on Wednesday 9/15 at 10:30 to have a sample drawn for these screens. We will also schedule her for an appointment for amniocentesis at that time. Results from Panorama NIPS will take 7-10 days to be returned. Results from Vistara NIPS will take 2-3 weeks to be returned. I will call Ms. Akhtar once results become available. She confirmed that she had no further questions at this time.  Gershon Crane, MS, [facility] Genetic Counselor

## 2020-07-09 ENCOUNTER — Telehealth: Payer: Self-pay | Admitting: Genetic Counselor

## 2020-07-09 ENCOUNTER — Other Ambulatory Visit: Payer: Self-pay | Admitting: Maternal & Fetal Medicine

## 2020-07-09 ENCOUNTER — Ambulatory Visit: Payer: No Typology Code available for payment source | Attending: Obstetrics and Gynecology

## 2020-07-09 ENCOUNTER — Other Ambulatory Visit: Payer: Self-pay

## 2020-07-09 DIAGNOSIS — O09511 Supervision of elderly primigravida, first trimester: Secondary | ICD-10-CM | POA: Diagnosis not present

## 2020-07-09 NOTE — Telephone Encounter (Signed)
I called Samantha Trevino to inform her of the date and time of her scheduled amniocentesis, as we were not able to schedule her when she came in for her lab appointment this morning. She has been scheduled for amniocentesis on 10/18 at 10:45 AM. Samantha Trevino confirmed that this worked for her.   Samantha Trevino inquired if results from amniocentesis would be back before the legal termination deadline in [place] should any abnormalities be identified. Given that Samantha Trevino will be [redacted]w[redacted]d on the date of her amniocentesis, results should be back before [redacted]w[redacted]d, the limit used by many facilities that perform termination procedures. We reviewed various testing options that could be ordered on an amniocentesis sample (FISH, karyotype, and chromosomal microarray), including information about what each test is looking for, turnaround time, and the benefits and limitations of each option. FISH and chromosomal microarray can be performed directly on an amniotic fluid sample but karyotype requires cell culturing. Occasionally, cells may be slow growing which can delay turnaround time. If Samantha Trevino's results were delayed, we could talk about additional options at that time.  I provided Samantha Trevino with some anticipatory guidance on what to expect the day of her amniocentesis procedure. She confirmed that she had no further questions at this time. I will call her within the next week to three weeks with results from the screens that she had her blood drawn for today (Panorama NIPS and Vistara).  Gershon Crane, MS, [facility] Genetic Counselor

## 2020-07-13 ENCOUNTER — Other Ambulatory Visit: Payer: Self-pay

## 2020-07-14 ENCOUNTER — Telehealth: Payer: Self-pay | Admitting: Genetic Counselor

## 2020-07-14 NOTE — Telephone Encounter (Signed)
Attempted to call Ms. Keysor again; however, I again received the busy signal and was unable to leave a message. Will try again tomorrow if I do not get a call back today.  Gershon Crane, MS, [facility] Genetic Counselor

## 2020-07-14 NOTE — Telephone Encounter (Signed)
Attempted to call Ms. Preece to discuss her low-risk Panorama noninvasive prenatal screening results; however, line was busy and I was unable to leave a message. Will try again later.  Gershon Crane, MS, [facility] Genetic Counselor

## 2020-07-15 ENCOUNTER — Telehealth: Payer: Self-pay | Admitting: Genetic Counselor

## 2020-07-15 NOTE — Telephone Encounter (Signed)
Received a call back from Ms. Bonet inquiring if any of her test results were available. She informed me that her phone was dropped in water and is not currently working but that she is getting it fixed today and her mobile phone number listed in Epic should be the same.   We reviewed Ms. Andujar's noninvasive prenatal screening (NIPS) results. Specifically, Ms. Faries had Panorama NIPS through [facility]. Testing was offered because of advanced maternal age. These results were negative, demonstrating an expected representation of chromosome 21, 18, 13, and sex chromosome material. This reduces the likelihood of trisomies 75, 60, or 75 and monosomy X for the pregnancy to less than 1 in 10,000. Ms. Punch requested to know about the expected fetal sex, which is female.  We reviewed that Ms. Dains's results from Vistara are still pending. Results will likely become available in another week to two weeks. I will call Ms. Tesmer once these results become available. She confirmed that she had no further questions at this time.  Gershon Crane, MS, [facility] Genetic Counselor

## 2020-07-21 DIAGNOSIS — Z3682 Encounter for antenatal screening for nuchal translucency: Secondary | ICD-10-CM | POA: Diagnosis not present

## 2020-07-28 ENCOUNTER — Other Ambulatory Visit: Payer: Self-pay | Admitting: Obstetrics & Gynecology

## 2020-07-28 DIAGNOSIS — O09522 Supervision of elderly multigravida, second trimester: Secondary | ICD-10-CM

## 2020-07-29 ENCOUNTER — Telehealth: Payer: Self-pay | Admitting: Genetic Counselor

## 2020-07-29 NOTE — Telephone Encounter (Signed)
I emailed Ms. Caraher informing her that results from her Javier [place] single gene noninvasive prenatal screening were normal. This significantly decreases the chances of the current fetus being affected by one of the 25 conditions included on the screen. I provided Ms. Sedlar with the contact information for Katrina Stack, our prenatal genetic counselor at [facility], in case she has further questions as I am currently out of the office.  Gershon Crane, MS, [facility] Genetic Counselor

## 2020-07-31 ENCOUNTER — Telehealth: Payer: Self-pay | Admitting: Obstetrics and Gynecology

## 2020-07-31 NOTE — Telephone Encounter (Signed)
I spoke with Ms. Seckinger regarding the results of her Vistara testing for this pregnancy.  To review, [facility]ara utilizes noninvasive prenatal screening (NIPS) to screen a pregnancy for 30 autosomal or X-linked dominant single gene disorders. These include conditions associated with advanced paternal age, such as Noonan syndrome, skeletal dysplasias, and craniosynostoses. Vistara is NOT diagnostic, and a positive result requires confirmation by CVS or amniocentesis. Ms. Schmuck was informed that her testing was negative for all conditions included (see report for details of conditions and detection rates).  We also reviewed that it is important to remember that this negative test result does not rule out all single gene disorders and does not eliminate the chance for these 30 conditions.  The patient had additional questions about amniocentesis, which she is scheduled for at [redacted] weeks gestation.  Though she has had normal results from the Panorama aneuploidy screening as well as negative results on this test, she would like additional information.  We discussed the difference between NIPS screening and a full karyotype as well as the option of chromosomal microarray testing from the amniocentesis sample.  At this time, she plans to proceed with the amniocentesis and expressed that termination of pregnancy would be an option if abnormal results were found.  I also discussed with her that no testing in pregnancy can detect all types of birth defect or predict all developmental differences.    If she has additional questions, she was encouraged to contact our clinic at [phone].  Cherly Anderson, MS, CGC

## 2020-08-06 DIAGNOSIS — Z361 Encounter for antenatal screening for raised alphafetoprotein level: Secondary | ICD-10-CM | POA: Diagnosis not present

## 2020-08-11 ENCOUNTER — Encounter: Payer: Self-pay | Admitting: *Deleted

## 2020-08-11 ENCOUNTER — Ambulatory Visit: Payer: No Typology Code available for payment source | Admitting: *Deleted

## 2020-08-11 ENCOUNTER — Ambulatory Visit: Payer: No Typology Code available for payment source

## 2020-08-11 ENCOUNTER — Ambulatory Visit: Payer: No Typology Code available for payment source | Attending: Obstetrics & Gynecology

## 2020-08-11 ENCOUNTER — Other Ambulatory Visit: Payer: Self-pay | Admitting: Obstetrics & Gynecology

## 2020-08-11 ENCOUNTER — Other Ambulatory Visit: Payer: Self-pay

## 2020-08-11 VITALS — BP 87/55 | HR 75

## 2020-08-11 DIAGNOSIS — Z363 Encounter for antenatal screening for malformations: Secondary | ICD-10-CM | POA: Diagnosis not present

## 2020-08-11 DIAGNOSIS — O09522 Supervision of elderly multigravida, second trimester: Secondary | ICD-10-CM

## 2020-08-11 DIAGNOSIS — Z3A16 16 weeks gestation of pregnancy: Secondary | ICD-10-CM | POA: Diagnosis not present

## 2020-08-11 DIAGNOSIS — Z36 Encounter for antenatal screening for chromosomal anomalies: Secondary | ICD-10-CM | POA: Diagnosis not present

## 2020-08-12 ENCOUNTER — Telehealth: Payer: Self-pay | Admitting: *Deleted

## 2020-08-14 ENCOUNTER — Telehealth: Payer: Self-pay | Admitting: Genetic Counselor

## 2020-08-14 NOTE — Telephone Encounter (Signed)
I attempted to call Ms. Shelley to inform her that her insurance company did not require precertification for the chromosomal microarray that can be added on to her amniocentesis sample. However, the number listed as her mobile phone in Epic was answered by someone other than Ms. Angevine. I emailed Ms. Rissler instead asking her if she would like me to add on this analysis to her sample. I also informed her that the AFP level on her amniocentesis sample was normal, confirming that the fetus does not have spina bifida. I asked her to send [place] an updated phone number that I can call once results from karyotype analysis on her amniocentesis are returned.  Gershon Crane, MS, [facility] Genetic Counselor

## 2020-08-19 LAB — MCC TRACKING

## 2020-08-26 ENCOUNTER — Telehealth: Payer: Self-pay | Admitting: Genetic Counselor

## 2020-08-26 NOTE — Telephone Encounter (Signed)
I called Samantha Trevino to discuss her negative karyotype results from amniocentesis. Results from fetal karyotype revealed a normal female complement (46,XX). This significantly reduces the possibility of a chromosomal aneuploidy such as Down syndrome, trisomy 43, or trisomy 71 in this fetus, as amniocentesis is able to diagnose chromosome aneuploidies with a sensitivity of >99%. We discussed that this testing assesses all of the chromosomes rather than the focusing only on chromosomes 21, 18, 13, X, and Y as NIPS does.  I reminded Samantha Trevino that chromosomal microarray to identify any smaller missing or extra pieces of chromosomal material that would fall below the detection range for karyotype is still pending. I reminded Samantha Trevino that microdeletions/microduplications found on chromosomal microarray are not associated with maternal age, but can still have implications for a child's health and/or development. Results from chromosomal microarray should be returned within the next week. If I do not receive these results by next Monday I will call [facility] to check in on the status of testing. I will call Samantha Trevino when results from chromosomal microarray become available. She confirmed that she had no further questions at this time.   Gershon Crane, MS, [facility] Genetic Counselor

## 2020-09-03 DIAGNOSIS — O09522 Supervision of elderly multigravida, second trimester: Secondary | ICD-10-CM | POA: Diagnosis not present

## 2020-09-03 DIAGNOSIS — O4402 Placenta previa specified as without hemorrhage, second trimester: Secondary | ICD-10-CM | POA: Diagnosis not present

## 2020-09-03 DIAGNOSIS — O2242 Hemorrhoids in pregnancy, second trimester: Secondary | ICD-10-CM | POA: Diagnosis not present

## 2020-09-03 DIAGNOSIS — Z3A19 19 weeks gestation of pregnancy: Secondary | ICD-10-CM | POA: Diagnosis not present

## 2020-09-03 LAB — MCC TRACKING

## 2020-09-05 ENCOUNTER — Telehealth: Payer: Self-pay | Admitting: Genetic Counselor

## 2020-09-05 LAB — MATERNAL CELL CONTAMINATION

## 2020-09-05 LAB — SPECIMEN STATUS REPORT

## 2020-09-05 NOTE — Telephone Encounter (Signed)
I attempted to call Ms. Smits to discuss her negative chromosomal microarray results from amniocentesis. However, there was no response and the call would not go to voicemail either time I called. Instead, I emailed Ms. Gambrel with a summary of her results.  I informed Ms. Mom that chromosomal microarray analysis revealed no significant DNA copy number changes or copy neutral regions and no maternal cell contamination. This significantly reduces the possibility of a chromosomal microdeletion or microduplication syndrome in this fetus. I reminded Ms. Conteh that while karyotype and chromosomal microarray cannot rule out all genetic syndromes, normal karyotype and chromosomal microarray analyses for the current fetus rule out clinically significant chromosomal abnormalities. I encouraged Ms. Hopple to contact me if she has any further questions.   Gershon Crane, MS, [facility] Genetic Counselor

## 2020-10-09 LAB — CHROMOSOME, AFP, AMNIOTIC FL
AFP, Amniotic Fluid (mcg/ml): 10.9 ug/mL
Cells Analyzed: 15
Cells Counted: 15
Cells Karyotyped: 2
Colonies: 15
GTG Band Resolution Achieved: 450
Gestational Age(Wks): 16
MOM, Amniotic Fluid: 0.85

## 2020-10-09 LAB — SNP MICROARRAY-PRENATAL (REVEAL)

## 2020-10-21 DIAGNOSIS — O09522 Supervision of elderly multigravida, second trimester: Secondary | ICD-10-CM | POA: Diagnosis not present

## 2020-10-21 DIAGNOSIS — O4402 Placenta previa specified as without hemorrhage, second trimester: Secondary | ICD-10-CM | POA: Diagnosis not present

## 2020-10-21 DIAGNOSIS — O358XX0 Maternal care for other (suspected) fetal abnormality and damage, not applicable or unspecified: Secondary | ICD-10-CM | POA: Diagnosis not present

## 2020-10-21 DIAGNOSIS — Z3A26 26 weeks gestation of pregnancy: Secondary | ICD-10-CM | POA: Diagnosis not present

## 2020-10-21 DIAGNOSIS — I83893 Varicose veins of bilateral lower extremities with other complications: Secondary | ICD-10-CM | POA: Diagnosis not present

## 2020-10-25 NOTE — L&D Delivery Note (Addendum)
Delivery Note At 12:04 AM a viable and healthy female was delivered via Vaginal, Spontaneous (Presentation:  LOA  ).  APGAR: 9, 9; weight  pending   Placenta status: Spontaneous, Intact.  Cord: 3 vessels with the following complications: Short.  Cord pH: NA  Anesthesia: Epidural Episiotomy: None Lacerations: 2nd degree;Perineal Suture Repair: 3.0 vicryl rapide Est. Blood Loss (mL): 617  Mom to postpartum.  Baby to Couplet care / Skin to Skin.  Cytotec 1000 mcg placed in rectum due to lower segment clots    Robley Fries 01/17/2021, 12:55 AM

## 2020-11-03 DIAGNOSIS — Z3689 Encounter for other specified antenatal screening: Secondary | ICD-10-CM | POA: Diagnosis not present

## 2020-11-03 DIAGNOSIS — O368930 Maternal care for other specified fetal problems, third trimester, not applicable or unspecified: Secondary | ICD-10-CM | POA: Diagnosis not present

## 2020-11-03 DIAGNOSIS — O09523 Supervision of elderly multigravida, third trimester: Secondary | ICD-10-CM | POA: Diagnosis not present

## 2020-11-03 DIAGNOSIS — Z3A28 28 weeks gestation of pregnancy: Secondary | ICD-10-CM | POA: Diagnosis not present

## 2020-11-03 DIAGNOSIS — Z3A30 30 weeks gestation of pregnancy: Secondary | ICD-10-CM | POA: Diagnosis not present

## 2020-11-03 DIAGNOSIS — O4443 Low lying placenta NOS or without hemorrhage, third trimester: Secondary | ICD-10-CM | POA: Diagnosis not present

## 2020-11-03 DIAGNOSIS — Z23 Encounter for immunization: Secondary | ICD-10-CM | POA: Diagnosis not present

## 2020-11-03 DIAGNOSIS — O4403 Placenta previa specified as without hemorrhage, third trimester: Secondary | ICD-10-CM | POA: Diagnosis not present

## 2020-11-03 DIAGNOSIS — Z3480 Encounter for supervision of other normal pregnancy, unspecified trimester: Secondary | ICD-10-CM | POA: Diagnosis not present

## 2020-11-18 DIAGNOSIS — O4443 Low lying placenta NOS or without hemorrhage, third trimester: Secondary | ICD-10-CM | POA: Diagnosis not present

## 2020-11-18 DIAGNOSIS — Z3A30 30 weeks gestation of pregnancy: Secondary | ICD-10-CM | POA: Diagnosis not present

## 2020-11-18 DIAGNOSIS — O09523 Supervision of elderly multigravida, third trimester: Secondary | ICD-10-CM | POA: Diagnosis not present

## 2020-11-18 DIAGNOSIS — O368930 Maternal care for other specified fetal problems, third trimester, not applicable or unspecified: Secondary | ICD-10-CM | POA: Diagnosis not present

## 2020-11-21 DIAGNOSIS — Z3483 Encounter for supervision of other normal pregnancy, third trimester: Secondary | ICD-10-CM | POA: Diagnosis not present

## 2020-11-21 DIAGNOSIS — Z3482 Encounter for supervision of other normal pregnancy, second trimester: Secondary | ICD-10-CM | POA: Diagnosis not present

## 2020-12-01 DIAGNOSIS — M5489 Other dorsalgia: Secondary | ICD-10-CM | POA: Diagnosis not present

## 2020-12-29 DIAGNOSIS — Z3685 Encounter for antenatal screening for Streptococcus B: Secondary | ICD-10-CM | POA: Diagnosis not present

## 2020-12-29 DIAGNOSIS — Z3689 Encounter for other specified antenatal screening: Secondary | ICD-10-CM | POA: Diagnosis not present

## 2020-12-29 DIAGNOSIS — O09523 Supervision of elderly multigravida, third trimester: Secondary | ICD-10-CM | POA: Diagnosis not present

## 2020-12-29 DIAGNOSIS — O368930 Maternal care for other specified fetal problems, third trimester, not applicable or unspecified: Secondary | ICD-10-CM | POA: Diagnosis not present

## 2020-12-29 DIAGNOSIS — Z3A36 36 weeks gestation of pregnancy: Secondary | ICD-10-CM | POA: Diagnosis not present

## 2020-12-31 LAB — OB RESULTS CONSOLE GBS
GBS: NEGATIVE
GBS: NEGATIVE

## 2021-01-08 ENCOUNTER — Telehealth (HOSPITAL_COMMUNITY): Payer: Self-pay | Admitting: *Deleted

## 2021-01-08 ENCOUNTER — Encounter (HOSPITAL_COMMUNITY): Payer: Self-pay | Admitting: *Deleted

## 2021-01-08 NOTE — Telephone Encounter (Signed)
Preadmission screen  

## 2021-01-09 ENCOUNTER — Other Ambulatory Visit: Payer: Self-pay | Admitting: Obstetrics & Gynecology

## 2021-01-12 ENCOUNTER — Telehealth (HOSPITAL_COMMUNITY): Payer: Self-pay | Admitting: *Deleted

## 2021-01-12 NOTE — Telephone Encounter (Signed)
Preadmission screen  

## 2021-01-14 ENCOUNTER — Other Ambulatory Visit (HOSPITAL_COMMUNITY)
Admission: RE | Admit: 2021-01-14 | Discharge: 2021-01-14 | Disposition: A | Discharge status: Home / Self Care | Payer: No Typology Code available for payment source | Source: Ambulatory Visit | Attending: Obstetrics & Gynecology | Admitting: Obstetrics & Gynecology

## 2021-01-14 DIAGNOSIS — M5430 Sciatica, unspecified side: Secondary | ICD-10-CM | POA: Diagnosis not present

## 2021-01-14 DIAGNOSIS — Z01812 Encounter for preprocedural laboratory examination: Secondary | ICD-10-CM | POA: Insufficient documentation

## 2021-01-14 DIAGNOSIS — Z20822 Contact with and (suspected) exposure to covid-19: Secondary | ICD-10-CM | POA: Insufficient documentation

## 2021-01-14 LAB — SARS CORONAVIRUS 2 (TAT 6-24 HRS): SARS Coronavirus 2: NEGATIVE

## 2021-01-16 ENCOUNTER — Inpatient Hospital Stay (HOSPITAL_COMMUNITY): Payer: No Typology Code available for payment source | Admitting: Anesthesiology

## 2021-01-16 ENCOUNTER — Inpatient Hospital Stay (HOSPITAL_COMMUNITY): Payer: No Typology Code available for payment source

## 2021-01-16 ENCOUNTER — Other Ambulatory Visit: Payer: Self-pay

## 2021-01-16 ENCOUNTER — Encounter (HOSPITAL_COMMUNITY): Payer: Self-pay | Admitting: Obstetrics & Gynecology

## 2021-01-16 ENCOUNTER — Inpatient Hospital Stay (EMERGENCY_DEPARTMENT_HOSPITAL)
Admission: AD | Admit: 2021-01-16 | Discharge: 2021-01-16 | Disposition: A | Discharge status: Home / Self Care | Payer: No Typology Code available for payment source | Source: Home / Self Care | Attending: Obstetrics and Gynecology | Admitting: Obstetrics and Gynecology

## 2021-01-16 ENCOUNTER — Inpatient Hospital Stay (HOSPITAL_COMMUNITY)
Admission: AD | Admit: 2021-01-16 | Discharge: 2021-01-18 | DRG: 807 | Disposition: A | Discharge status: Home / Self Care | Payer: No Typology Code available for payment source | Attending: Obstetrics & Gynecology | Admitting: Obstetrics & Gynecology

## 2021-01-16 DIAGNOSIS — Z87891 Personal history of nicotine dependence: Secondary | ICD-10-CM

## 2021-01-16 DIAGNOSIS — Z3A39 39 weeks gestation of pregnancy: Secondary | ICD-10-CM | POA: Insufficient documentation

## 2021-01-16 DIAGNOSIS — O471 False labor at or after 37 completed weeks of gestation: Secondary | ICD-10-CM | POA: Insufficient documentation

## 2021-01-16 DIAGNOSIS — O479 False labor, unspecified: Secondary | ICD-10-CM

## 2021-01-16 DIAGNOSIS — Z20822 Contact with and (suspected) exposure to covid-19: Secondary | ICD-10-CM | POA: Diagnosis present

## 2021-01-16 DIAGNOSIS — O26893 Other specified pregnancy related conditions, third trimester: Secondary | ICD-10-CM | POA: Diagnosis present

## 2021-01-16 DIAGNOSIS — Z3490 Encounter for supervision of normal pregnancy, unspecified, unspecified trimester: Secondary | ICD-10-CM | POA: Diagnosis present

## 2021-01-16 LAB — CBC
HCT: 43.7 % (ref 36.0–46.0)
Hemoglobin: 14.7 g/dL (ref 12.0–15.0)
MCH: 30.4 pg (ref 26.0–34.0)
MCHC: 33.6 g/dL (ref 30.0–36.0)
MCV: 90.5 fL (ref 80.0–100.0)
Platelets: 194 10*3/uL (ref 150–400)
RBC: 4.83 MIL/uL (ref 3.87–5.11)
RDW: 13.9 % (ref 11.5–15.5)
WBC: 7 10*3/uL (ref 4.0–10.5)
nRBC: 0 % (ref 0.0–0.2)

## 2021-01-16 LAB — TYPE AND SCREEN
ABO/RH(D): O POS
Antibody Screen: NEGATIVE

## 2021-01-16 MED ORDER — SOD CITRATE-CITRIC ACID 500-334 MG/5ML PO SOLN: 30.0000 mL | ORAL | Status: DC | PRN

## 2021-01-16 MED ORDER — ONDANSETRON HCL 4 MG/2ML IJ SOLN: 4.0000 mg | Freq: Four times a day (QID) | INTRAMUSCULAR | Status: DC | PRN

## 2021-01-16 MED ORDER — EPHEDRINE 5 MG/ML INJ: 10.0000 mg | INTRAVENOUS | Status: DC | PRN

## 2021-01-16 MED ORDER — DIPHENHYDRAMINE HCL 50 MG/ML IJ SOLN: 12.5000 mg | INTRAMUSCULAR | Status: DC | PRN

## 2021-01-16 MED ORDER — LACTATED RINGERS IV SOLN: 500.0000 mL | Freq: Once | INTRAVENOUS | Status: DC

## 2021-01-16 MED ORDER — PHENYLEPHRINE 40 MCG/ML (10ML) SYRINGE FOR IV PUSH (FOR BLOOD PRESSURE SUPPORT): 80.0000 ug | PREFILLED_SYRINGE | INTRAVENOUS | Status: DC | PRN

## 2021-01-16 MED ORDER — LACTATED RINGERS IV SOLN: INTRAVENOUS | Status: DC

## 2021-01-16 MED ORDER — OXYTOCIN-SODIUM CHLORIDE 30-0.9 UT/500ML-% IV SOLN
1.0000 m[IU]/min | INTRAVENOUS | Status: DC
  Administered 2021-01-16: 4 m[IU]/min via INTRAVENOUS
  Administered 2021-01-16: 2 m[IU]/min via INTRAVENOUS
  Administered 2021-01-16: 6 m[IU]/min via INTRAVENOUS
  Filled 2021-01-16: qty 500

## 2021-01-16 MED ORDER — ACETAMINOPHEN 325 MG PO TABS: 650.0000 mg | ORAL_TABLET | ORAL | Status: DC | PRN

## 2021-01-16 MED ORDER — TERBUTALINE SULFATE 1 MG/ML IJ SOLN: 0.2500 mg | Freq: Once | INTRAMUSCULAR | Status: DC | PRN

## 2021-01-16 MED ORDER — OXYTOCIN-SODIUM CHLORIDE 30-0.9 UT/500ML-% IV SOLN: 2.5000 [IU]/h | INTRAVENOUS | Status: DC

## 2021-01-16 MED ORDER — FENTANYL-BUPIVACAINE-NACL 0.5-0.125-0.9 MG/250ML-% EP SOLN
12.0000 mL/h | EPIDURAL | Status: DC | PRN
Start: 2021-01-16 — End: 2021-01-17
  Administered 2021-01-16: 12 mL/h via EPIDURAL

## 2021-01-16 MED ORDER — LIDOCAINE-EPINEPHRINE (PF) 2 %-1:200000 IJ SOLN
INTRAMUSCULAR | Status: DC | PRN
  Administered 2021-01-16: 5 mL via EPIDURAL

## 2021-01-16 MED ORDER — FENTANYL-BUPIVACAINE-NACL 0.5-0.125-0.9 MG/250ML-% EP SOLN: 12.0000 mL/h | EPIDURAL | Status: DC | PRN

## 2021-01-16 MED ORDER — OXYTOCIN BOLUS FROM INFUSION
333.0000 mL | Freq: Once | INTRAVENOUS | Status: AC
  Administered 2021-01-17: 333 mL via INTRAVENOUS

## 2021-01-16 MED ORDER — LACTATED RINGERS IV SOLN: 500.0000 mL | INTRAVENOUS | Status: DC | PRN

## 2021-01-16 MED ORDER — LACTATED RINGERS IV SOLN
500.0000 mL | Freq: Once | INTRAVENOUS | Status: AC
  Administered 2021-01-16: 500 mL via INTRAVENOUS

## 2021-01-16 MED ORDER — LIDOCAINE HCL (PF) 1 % IJ SOLN
30.0000 mL | INTRAMUSCULAR | Status: AC | PRN
  Administered 2021-01-17: 30 mL via SUBCUTANEOUS
  Filled 2021-01-16: qty 30

## 2021-01-16 MED ORDER — FENTANYL-BUPIVACAINE-NACL 0.5-0.125-0.9 MG/250ML-% EP SOLN
12.0000 mL/h | EPIDURAL | Status: DC | PRN
Start: 2021-01-16 — End: 2021-01-16
  Filled 2021-01-16: qty 250

## 2021-01-16 NOTE — Anesthesia Preprocedure Evaluation (Signed)
Anesthesia Evaluation  Patient identified by MRN, date of birth, ID band Patient awake    Reviewed: Allergy & Precautions, Patient's Chart, lab work & pertinent test results  Airway Mallampati: I       Dental   Pulmonary    Pulmonary exam normal        Cardiovascular Normal cardiovascular exam     Neuro/Psych    GI/Hepatic   Endo/Other    Renal/GU      Musculoskeletal   Abdominal   Peds  Hematology   Anesthesia Other Findings   Reproductive/Obstetrics (+) Pregnancy                             Anesthesia Physical Anesthesia Plan  ASA: II  Anesthesia Plan: Epidural   Post-op Pain Management:    Induction:   PONV Risk Score and Plan: 0  Airway Management Planned: Natural Airway  Additional Equipment: None  Intra-op Plan:   Post-operative Plan:   Informed Consent: I have reviewed the patients History and Physical, chart, labs and discussed the procedure including the risks, benefits and alternatives for the proposed anesthesia with the patient or authorized representative who has indicated his/her understanding and acceptance.       Plan Discussed with:   Anesthesia Plan Comments: (Lab Results      Component                Value               Date                      WBC                      7.0                 01/16/2021                HGB                      14.7                01/16/2021                HCT                      43.7                01/16/2021                MCV                      90.5                01/16/2021                PLT                      194                 01/16/2021           )        Anesthesia Quick Evaluation

## 2021-01-16 NOTE — H&P (Signed)
Samantha Trevino is a 40 y.o. female presenting for IOL at 39 wks.  EDC 01/23/21 by 1st trim sono.  [id number], spontaneous pregnancy, 39 wks, AMA, nl NIPT and also did amniocentesis - 91 XX.  Previa resolved in 3rd trim, no bleeding Fetal pyelectasis- persisted at 36 wk sono- AGA. EFW 6'4" 44% AC 52%, Vx, AFI 12 cm. LEFT pyelectasis at 15 mm   OB History    Gravida  3   Para  1   Term  1   Preterm      AB  1   Living  1     SAB  1   IAB      Ectopic      Multiple      Live Births  1          Past Medical History:  Diagnosis Date  . Medical history non-contributory    Past Surgical History:  Procedure Laterality Date  . DILATION AND CURETTAGE OF UTERUS    . NO PAST SURGERIES     Family History: family history includes Heart disease in her father; Hypertension in her father. Social History:  reports that she has never smoked. She quit smokeless tobacco use about 8 years ago. She reports that she does not drink alcohol and does not use drugs.     Maternal Diabetes: No Genetic Screening: Normal Maternal Ultrasounds/Referrals: Fetal renal pyelectasis Fetal Ultrasounds or other Referrals:  Referred to Materal Fetal Medicine , did amniocentesis for AMA, normal Karyotype 39 XX Maternal Substance Abuse:  No Significant Maternal Medications:  None Significant Maternal Lab Results:  Group B Strep negative Other Comments:  None  Review of Systems History Dilation: 3.5 Effacement (%): 60 Station: -2 Exam by:: erin davis rn Blood pressure 121/70, pulse 77, temperature 98.4 F (36.9 C), temperature source Oral, resp. rate 16, height 5\' 2"  (1.575 m), weight 64.7 kg, SpO2 100 %, unknown if currently breastfeeding. Exam Physical Exam  Physical exam:  A&O x 3, no acute distress. Pleasant HEENT neg, no thyromegaly Lungs CTA bilat CV RRR, A1S2 normal Abdo soft, non tender, non acute Extr no edema/ tenderness Pelvic early labor this morning now at 3.5 cm and effacing.   FHT  150s + accels no decels mod variability, cat I  Toco q 3 min  Prenatal labs: ABO, Rh: --/--/O POS (03/25 1725) Antibody: NEG (03/25 1725) Rubella: Immune (08/02 0000) RPR: Nonreactive (08/02 0000)  HBsAg: Negative (08/02 0000)  HIV: Non-reactive (08/02 0000)  GBS: Negative, Negative/-- (03/09 0000)  AFP1 nl Amniocentesis fetal Karyotype 53 XX  GLucola nl   Assessment/Plan: 40 yo G3P1011, 39 wks elective IOL. Prior term SVD. Now EFW 7.1/2 lbs, anticipate SVD Pitocin IOL, GBS(-), epidural per pt    30 01/16/2021, 7:27 PM

## 2021-01-16 NOTE — MAU Provider Note (Signed)
S: Patient is here for RN labor evaluation. Strip, vital signs, & chart Reviewed   O:  Vitals:   01/16/21 1016 01/16/21 1019  BP:  104/69  Pulse:  80  Resp:  16  Temp:  98.3 F (36.8 C)  TempSrc:  Oral  SpO2:  99%  Weight: 64.2 kg   Height: 5\' 2"  (1.575 m)    No results found for this or any previous visit (from the past 24 hour(s)).  Dilation: 3 Effacement (%): 50 Cervical Position: Posterior Station: -3 Presentation: Vertex Exam by:: Holly Flippin RN   FHR: 135 bpm, Mod Var, no Decels, 15x15 Accels UC: UI   A: 1. False labor   2. [redacted] weeks gestation of pregnancy   3. Postpartum care following vaginal delivery (10/25)      P:  RN to discharge home in stable condition with return precautions & fetal kick counts Patient scheduled for elective induction today - birthing suites has not called her yet; patient is comfortable going home & waiting for call.   12-25-1980 FNP 12:01 PM

## 2021-01-16 NOTE — Anesthesia Procedure Notes (Signed)
Epidural Patient location during procedure: OB Start time: 01/16/2021 10:32 PM End time: 01/16/2021 10:40 PM  Staffing Anesthesiologist: Shelton Silvas, MD Performed: anesthesiologist   Preanesthetic Checklist Completed: patient identified, IV checked, site marked, risks and benefits discussed, surgical consent, monitors and equipment checked, pre-op evaluation and timeout performed  Epidural Patient position: sitting Prep: DuraPrep Patient monitoring: heart rate, continuous pulse ox and blood pressure Approach: midline Location: L3-L4 Injection technique: LOR saline  Needle:  Needle type: Tuohy  Needle gauge: 17 G Needle length: 9 cm Catheter type: closed end flexible Catheter size: 20 Guage Test dose: negative and 1.5% lidocaine  Assessment Events: blood not aspirated, injection not painful, no injection resistance and no paresthesia  Additional Notes LOR @ 4.5  Pt unable to get in good position due to contractions and anxiety, attempted 1st level, restarted and successful at second level.   Patient identified. Risks/Benefits/Options discussed with patient including but not limited to bleeding, infection, nerve damage, paralysis, failed block, incomplete pain control, headache, blood pressure changes, nausea, vomiting, reactions to medications, itching and postpartum back pain. Confirmed with bedside nurse the patient's most recent platelet count. Confirmed with patient that they are not currently taking any anticoagulation, have any bleeding history or any family history of bleeding disorders. Patient expressed understanding and wished to proceed. All questions were answered. Sterile technique was used throughout the entire procedure. Please see nursing notes for vital signs. Test dose was given through epidural catheter and negative prior to continuing to dose epidural or start infusion. Warning signs of high block given to the patient including shortness of breath,  tingling/numbness in hands, complete motor block, or any concerning symptoms with instructions to call for help. Patient was given instructions on fall risk and not to get out of bed. All questions and concerns addressed with instructions to call with any issues or inadequate analgesia.    Reason for block:procedure for pain

## 2021-01-16 NOTE — MAU Note (Signed)
Samantha Trevino is a 40 y.o. at [redacted]w[redacted]d here in MAU reporting: lost her mucus plug last night and this AM. Having intermittent pain associated when the baby is moving. Scheduled for induction today. Saw some blood in the mucus plug. No LOF. +FM  Onset of complaint: last night  Pain score: 4/10  Vitals:   01/16/21 1019  BP: 104/69  Pulse: 80  Resp: 16  Temp: 98.3 F (36.8 C)  SpO2: 99%     FHT: +FM  Lab orders placed from triage: none

## 2021-01-16 NOTE — Discharge Instructions (Signed)
Fetal Movement Counts Patient Name: ________________________________________________ Patient Due Date: ____________________  What is a fetal movement count? A fetal movement count is the number of times that you feel your baby move during a certain amount of time. This may also be called a fetal kick count. A fetal movement count is recommended for every pregnant woman. You may be asked to start counting fetal movements as early as week 28 of your pregnancy. Pay attention to when your baby is most active. You may notice your baby's sleep and wake cycles. You may also notice things that make your baby move more. You should do a fetal movement count:  When your baby is normally most active.  At the same time each day. A good time to count movements is while you are resting, after having something to eat and drink. How do I count fetal movements? 1. Find a quiet, comfortable area. Sit, or lie down on your side. 2. Write down the date, the start time and stop time, and the number of movements that you felt between those two times. Take this information with you to your health care visits. 3. Write down your start time when you feel the first movement. 4. Count kicks, flutters, swishes, rolls, and jabs. You should feel at least 10 movements. 5. You may stop counting after you have felt 10 movements, or if you have been counting for 2 hours. Write down the stop time. 6. If you do not feel 10 movements in 2 hours, contact your health care provider for further instructions. Your health care provider may want to do additional tests to assess your baby's well-being. Contact a health care provider if:  You feel fewer than 10 movements in 2 hours.  Your baby is not moving like he or she usually does. Date: ____________ Start time: ____________ Stop time: ____________ Movements: ____________ Date: ____________ Start time: ____________ Stop time: ____________ Movements: ____________ Date: ____________  Start time: ____________ Stop time: ____________ Movements: ____________ Date: ____________ Start time: ____________ Stop time: ____________ Movements: ____________ Date: ____________ Start time: ____________ Stop time: ____________ Movements: ____________ Date: ____________ Start time: ____________ Stop time: ____________ Movements: ____________ Date: ____________ Start time: ____________ Stop time: ____________ Movements: ____________ Date: ____________ Start time: ____________ Stop time: ____________ Movements: ____________ Date: ____________ Start time: ____________ Stop time: ____________ Movements: ____________ This information is not intended to replace advice given to you by your health care provider. Make sure you discuss any questions you have with your health care provider. Document Revised: 05/31/2019 Document Reviewed: 05/31/2019 Elsevier Patient Education  2021 [employer]  

## 2021-01-16 NOTE — Progress Notes (Signed)
Patient ID: Samantha Trevino, female   DOB: [birth year], 40 y.o.   MRN: [id number] Subjective: Doing well, tolerating UCs, didn't take epidural 1st time but considering this time   Objective: BP 124/73 (BP Location: Right Arm)   Pulse 74   Temp 97.9 F (36.6 C) (Oral)   Resp 16   Ht 5\' 2"  (1.575 m)   Wt 64.7 kg   SpO2 100%   BMI 26.08 kg/m    FHT:  FHR: 150 bpm, variability: moderate,  accelerations:  Present,  decelerations:  Absent UC:   regular, every 3 minutes SVE:   Dilation: 5.5 Effacement (%): 80 Station: -2 Exam by:: Mody, MD Pitocin at 12 AROM, clear   Assessment / Plan: Augmentation of labor, progressing well  Fetal Wellbeing:  Category I Pain Control:  Labor support without medications and Epidural per pt's request   Anticipated MOD:  NSVD, EFW 8 lbs  [phone] 01/16/2021, 10:06 PM

## 2021-01-17 ENCOUNTER — Encounter (HOSPITAL_COMMUNITY): Payer: Self-pay | Admitting: Obstetrics & Gynecology

## 2021-01-17 LAB — CBC
HCT: 36.7 % (ref 36.0–46.0)
Hemoglobin: 12.4 g/dL (ref 12.0–15.0)
MCH: 30.4 pg (ref 26.0–34.0)
MCHC: 33.8 g/dL (ref 30.0–36.0)
MCV: 90 fL (ref 80.0–100.0)
Platelets: 165 10*3/uL (ref 150–400)
RBC: 4.08 MIL/uL (ref 3.87–5.11)
RDW: 13.5 % (ref 11.5–15.5)
WBC: 11.2 10*3/uL — ABNORMAL HIGH (ref 4.0–10.5)
nRBC: 0 % (ref 0.0–0.2)

## 2021-01-17 LAB — RPR: RPR Ser Ql: NONREACTIVE

## 2021-01-17 MED ORDER — ONDANSETRON HCL 4 MG/2ML IJ SOLN: 4.0000 mg | INTRAMUSCULAR | Status: DC | PRN

## 2021-01-17 MED ORDER — MISOPROSTOL 200 MCG PO TABS
ORAL_TABLET | ORAL | Status: AC
  Filled 2021-01-17: qty 5

## 2021-01-17 MED ORDER — TETANUS-DIPHTH-ACELL PERTUSSIS 5-2.5-18.5 LF-MCG/0.5 IM SUSY: 0.5000 mL | PREFILLED_SYRINGE | Freq: Once | INTRAMUSCULAR | Status: DC

## 2021-01-17 MED ORDER — MISOPROSTOL 200 MCG PO TABS
1000.0000 ug | ORAL_TABLET | Freq: Once | ORAL | Status: AC
  Administered 2021-01-17: 1000 ug via RECTAL

## 2021-01-17 MED ORDER — IBUPROFEN 600 MG PO TABS
600.0000 mg | ORAL_TABLET | Freq: Four times a day (QID) | ORAL | Status: DC
  Administered 2021-01-17 – 2021-01-18 (×5): 600 mg via ORAL
  Filled 2021-01-17 (×5): qty 1

## 2021-01-17 MED ORDER — DIBUCAINE (PERIANAL) 1 % EX OINT: 1.0000 "application " | TOPICAL_OINTMENT | CUTANEOUS | Status: DC | PRN

## 2021-01-17 MED ORDER — ONDANSETRON HCL 4 MG PO TABS: 4.0000 mg | ORAL_TABLET | ORAL | Status: DC | PRN

## 2021-01-17 MED ORDER — ZOLPIDEM TARTRATE 5 MG PO TABS: 5.0000 mg | ORAL_TABLET | Freq: Every evening | ORAL | Status: DC | PRN

## 2021-01-17 MED ORDER — COCONUT OIL OIL
1.0000 | TOPICAL_OIL | Status: DC | PRN
Start: 2021-01-17 — End: 2021-01-18

## 2021-01-17 MED ORDER — DIPHENHYDRAMINE HCL 25 MG PO CAPS: 25.0000 mg | ORAL_CAPSULE | Freq: Four times a day (QID) | ORAL | Status: DC | PRN

## 2021-01-17 MED ORDER — SENNOSIDES-DOCUSATE SODIUM 8.6-50 MG PO TABS: 2.0000 | ORAL_TABLET | Freq: Every day | ORAL | Status: DC

## 2021-01-17 MED ORDER — WITCH HAZEL-GLYCERIN EX PADS: 1.0000 "application " | MEDICATED_PAD | CUTANEOUS | Status: DC | PRN

## 2021-01-17 MED ORDER — BENZOCAINE-MENTHOL 20-0.5 % EX AERO: 1.0000 "application " | INHALATION_SPRAY | CUTANEOUS | Status: DC | PRN

## 2021-01-17 MED ORDER — ACETAMINOPHEN 325 MG PO TABS
650.0000 mg | ORAL_TABLET | ORAL | Status: DC | PRN
  Administered 2021-01-17: 650 mg via ORAL
  Filled 2021-01-17: qty 2

## 2021-01-17 MED ORDER — SIMETHICONE 80 MG PO CHEW: 80.0000 mg | CHEWABLE_TABLET | ORAL | Status: DC | PRN

## 2021-01-17 MED ORDER — PRENATAL MULTIVITAMIN CH
1.0000 | ORAL_TABLET | Freq: Every day | ORAL | Status: DC
  Administered 2021-01-17: 1 via ORAL
  Filled 2021-01-17: qty 1

## 2021-01-17 NOTE — Lactation Note (Signed)
This note was copied from a baby's chart. Lactation Consultation Note  Patient Name: Samantha Trevino NOMVE'H Date: 01/17/2021 Reason for consult: Follow-up assessment Age:38 hours  Mom resting in bed, baby asleep on back in bassinet, dad resting on couch. Mom reports damage to right breast r/t latch, states baby just finished feeding prior to [facility] entering the room. Mom denies noting pinched/ compressed nipples after feedings. Mom reports unable to latch first baby (child now 9yo) therefore pumped and offered EBM via bottle x62mo. Mom's goal is to latch and feed this baby.   Baby now with cues, baby sucked on LC's gloved finger, moderate suck and high palate noted. Baby up to right breast skin to skin, mom latched shallowly to breast. LC broke latch, advised sandwich around border of areola, mom reports with more comfort. Comfort Gels given with instructions.   Reinforced cue based feedings, wake if >3hrs since last feeding, skin to skin, expect 8-12 feedings every 24hrs. Advised no LC over night, call RN if latch support needed prior to next [facility] visit. Mom voiced understanding and with no further concerns. BGilliam, RN, IBCLC   Feeding Mother's Current Feeding Choice: Breast Milk and Formula  LATCH Score Latch: Grasps breast easily, tongue down, lips flanged, rhythmical sucking.  Audible Swallowing: Spontaneous and intermittent  Type of Nipple: Everted at rest and after stimulation  Comfort (Breast/Nipple): Filling, red/small blisters or bruises, mild/mod discomfort  Hold (Positioning): Assistance needed to correctly position infant at breast and maintain latch.  LATCH Score: 8   Lactation Tools Discussed/Used    Interventions Interventions: Breast feeding basics reviewed;Assisted with latch;Skin to skin;Breast compression;Comfort gels  Discharge    Consult Status Consult Status: Follow-up Date: 01/18/21 Follow-up type: In-patient    Charlynn Court 01/17/2021, 10:59  PM

## 2021-01-17 NOTE — Lactation Note (Signed)
This note was copied from a baby's chart. Lactation Consultation Note  Patient Name: Samantha Trevino YYTKP'T Date: 01/17/2021 Reason for consult: L&D Initial assessment Age:38 hours P2, term female infant. LC entered L&D room, mom was doing STS with infant. Mom latched infant on her right breast using the football hold position, infant latched with depth and was still breastfeeding after 12 minutes when LC left the room. Mom is excited that infant is breastfeeding, she feels only a tug and no pain with latch. Mom knows to breastfeed infant according to primal cues: licking, tasting, smacking, hands in mouth and rooting, STS. Mom knows to call RN or LC on MBU for assistance with latching infant at the breast if needed. LC discussed infant's I&O with parents.   Maternal Data Has patient been taught Hand Expression?: Yes Does the patient have breastfeeding experience prior to this delivery?: Yes How long did the patient breastfeed?: Per mom, her 77 year old son nevet latched, she pumped only for 6 months then supplemented with formula.  Feeding Mother's Current Feeding Choice: Breast Milk  LATCH Score Latch: Grasps breast easily, tongue down, lips flanged, rhythmical sucking.  Audible Swallowing: Spontaneous and intermittent  Type of Nipple: Everted at rest and after stimulation  Comfort (Breast/Nipple): Soft / non-tender  Hold (Positioning): Assistance needed to correctly position infant at breast and maintain latch.  LATCH Score: 9   Lactation Tools Discussed/Used    Interventions Interventions: Breast feeding basics reviewed;Assisted with latch;Skin to skin;Breast compression;Adjust position;Support pillows;Position options;Expressed milk;Education  Discharge Pump: Personal WIC Program: No  Consult Status Consult Status: Follow-up Date: 01/17/21 Follow-up type: In-patient    Danelle Earthly 01/17/2021, 1:09 AM

## 2021-01-17 NOTE — Anesthesia Postprocedure Evaluation (Signed)
Anesthesia Post Note  Patient: Samantha Trevino  Procedure(s) Performed: AN AD HOC LABOR EPIDURAL     Patient location during evaluation: Mother Baby Anesthesia Type: Epidural Level of consciousness: awake and alert Pain management: pain level controlled Vital Signs Assessment: post-procedure vital signs reviewed and stable Respiratory status: spontaneous breathing Cardiovascular status: stable Postop Assessment: no headache, adequate PO intake, no backache, patient able to bend at knees, able to ambulate, epidural receding and no apparent nausea or vomiting Anesthetic complications: no   No complications documented.  Last Vitals:  Vitals:   01/17/21 0330 01/17/21 0736  BP: 108/71 98/64  Pulse: 75 76  Resp: 18 18  Temp: 37.2 C 36.9 C  SpO2: 100% 99%    Last Pain:  Vitals:   01/17/21 0736  TempSrc: Oral  PainSc: 0-No pain   Pain Goal:                   Salome Arnt

## 2021-01-17 NOTE — Progress Notes (Signed)
Post Partum Day 1, SVD after IOL at 39 wks, GIRL  2nd dgr perineal lac   Subjective: no complaints, up ad lib, voiding, tolerating PO and + flatus  Objective: Blood pressure 98/64, pulse 76, temperature 98.5 F (36.9 C), temperature source Oral, resp. rate 18, height 5\' 2"  (1.575 m), weight 64.7 kg, SpO2 99 %, unknown if currently breastfeeding.  Physical Exam:  General: alert and cooperative Lochia: appropriate Uterine Fundus: firm Incision: healing well DVT Evaluation: No evidence of DVT seen on physical exam.  Recent Labs    01/16/21 1730 01/17/21 0452  HGB 14.7 12.4  HCT 43.7 36.7   O(+) Rub Imm All vaccines done  Assessment/Plan: Breastfeeding and Contraception will d/w pt in office.   Ppd #1 DOING WELL  D/c tomorrow AM F/up office 6 wks   LOS: 1 day   01/19/21 01/17/2021, 11:18 AM

## 2021-01-18 MED ORDER — ACETAMINOPHEN 325 MG PO TABS: 650.0000 mg | ORAL_TABLET | ORAL | Status: DC | PRN

## 2021-01-18 MED ORDER — IBUPROFEN 600 MG PO TABS: 600.0000 mg | ORAL_TABLET | Freq: Four times a day (QID) | ORAL | 0 refills | Status: DC

## 2021-01-18 NOTE — Lactation Note (Signed)
This note was copied from a baby's chart. Lactation Consultation Note  Patient Name: Samantha Trevino BWIOM'B Date: 01/18/2021 Reason for consult: Follow-up assessment Age:38 hours  P2 mother whose infant is now 4 hours old.  This is a term baby at 39+1 weeks.    Mother requested latch observation prior to discharge.  Baby was sleeping when I arrived.  However, mother gave permission for me to awaken baby and observe latching.  Reviewed how to effectively latch and assisted with the cross cradle position.  Mother had only used the football hold until this time.  Baby opened wide and latched easily.  All questions answered.  Mother has a DEBP for home use.  Father supportive.  RN has completed discharge and family will call when they are ready to be escorted out.    Maternal Data    Feeding    LATCH Score Latch: Grasps breast easily, tongue down, lips flanged, rhythmical sucking.  Audible Swallowing: Spontaneous and intermittent  Type of Nipple: Everted at rest and after stimulation  Comfort (Breast/Nipple): Soft / non-tender  Hold (Positioning): Assistance needed to correctly position infant at breast and maintain latch.  LATCH Score: 9   Lactation Tools Discussed/Used    Interventions Interventions: Breast feeding basics reviewed;Assisted with latch;Skin to skin;Breast massage;Hand express;Breast compression;Adjust position;Comfort gels;Position options;Support pillows;Education  Discharge Pump: Personal  Consult Status Consult Status: Complete Date: 01/18/21 Follow-up type: Call as needed    Irene Pap DelFava 01/18/2021, 11:41 AM

## 2021-01-18 NOTE — Discharge Summary (Signed)
Postpartum Discharge Summary    Patient Name: Samantha Trevino DOB: May 23, 1981 MRN: [id number]  Date of admission: 01/16/2021 Delivery date:01/17/2021  Delivering provider: Japji Kok  Date of discharge: 01/18/2021  Admitting diagnosis: Encounter for induction of labor [Z34.90] SVD (spontaneous vaginal delivery) [O80] Intrauterine pregnancy: [redacted]w[redacted]d     Secondary diagnosis:  Active Problems:   Encounter for induction of labor   SVD (spontaneous vaginal delivery)    Discharge diagnosis: Term Pregnancy Delivered                                              Post partum procedures:None Augmentation: AROM and Pitocin Complications: None  Hospital course: Induction of Labor With Vaginal Delivery   40 y.o. yo [id number] at [redacted]w[redacted]d was admitted to the hospital 01/16/2021 for induction of labor.  Indication for induction: Elective.  Patient had an uncomplicated labor course as follows: Membrane Rupture Time/Date: 9:35 PM ,01/16/2021   Delivery Method:Vaginal, Spontaneous  Episiotomy: None  Lacerations:  2nd degree;Perineal  Details of delivery can be found in separate delivery note.  Patient had a routine postpartum course. Patient is discharged home 01/18/21.  Newborn Data: Birth date:01/17/2021  Birth time:12:04 AM  Gender:Female  Living status:Living  Apgars:9 ,9  Weight:3291 g    T-DaP:Given prenatally Flu: Yes Transfusion:No  Physical exam  Vitals:   01/17/21 1129 01/17/21 1544 01/17/21 1954 01/18/21 0532  BP: 116/73 104/67 101/68 109/76  Pulse: 74 85 83 71  Resp: 18 18 17 18   Temp: 98.1 F (36.7 C) 98.2 F (36.8 C) 98.7 F (37.1 C) 97.8 F (36.6 C)  TempSrc: Oral Oral Oral Oral  SpO2:  99% 99% 100%  Weight:      Height:       General: alert Lochia: appropriate Uterine Fundus: firm Incision: Healing well with no significant drainage DVT Evaluation: No evidence of DVT seen on physical exam. Labs: Lab Results  Component Value Date   WBC 11.2 (H) 01/17/2021   HGB  12.4 01/17/2021   HCT 36.7 01/17/2021   MCV 90.0 01/17/2021   PLT 165 01/17/2021   No flowsheet data found. Edinburgh Score: Edinburgh Postnatal Depression Scale Screening Tool 01/18/2021  I have been able to laugh and see the funny side of things. 0  I have looked forward with enjoyment to things. 0  I have blamed myself unnecessarily when things went wrong. 0  I have been anxious or worried for no good reason. 0  I have felt scared or panicky for no good reason. 0  Things have been getting on top of me. 0  I have been so unhappy that I have had difficulty sleeping. 0  I have felt sad or miserable. 0  I have been so unhappy that I have been crying. 0  The thought of harming myself has occurred to me. 0  Edinburgh Postnatal Depression Scale Total 0      After visit meds:  Allergies as of 01/18/2021   No Known Allergies     Medication List    TAKE these medications   acetaminophen 325 MG tablet Commonly known as: Tylenol Take 2 tablets (650 mg total) by mouth every 4 (four) hours as needed (for pain scale < 4).   ibuprofen 600 MG tablet Commonly known as: ADVIL Take 1 tablet (600 mg total) by mouth every 6 (six) hours.  multivitamin-prenatal 27-0.8 MG Tabs tablet Take 1 tablet by mouth daily.        Discharge home in stable condition Infant Feeding: Breast Infant Disposition:home with mother Discharge instruction: per After Visit Summary and Postpartum booklet. Activity: Advance as tolerated. Pelvic rest for 6 weeks.  Diet: routine diet Anticipated Birth Control: Unsure Postpartum Appointment:6 weeks- 8 weeks  Follow up Visit:   Follow-up Information    Shea Evans, MD Follow up in 7 week(s).   Specialty: Obstetrics and Gynecology Why: after 03/09/21 with Dr Talbert Forest information: [address] [place] [address] [phone]               01/18/2021 Robley Fries, MD

## 2021-01-18 NOTE — Lactation Note (Signed)
This note was copied from a baby's chart. Lactation Consultation Note  Patient Name: Samantha Trevino LHTDS'K Date: 01/18/2021 Reason for consult: Follow-up assessment Age:38 hours   LC Follow Up Visit:  Attempted to visit with mother, however, she was in the shower.  Father will call me when mother is able to visit.   Maternal Data    Feeding    LATCH Score                    Lactation Tools Discussed/Used    Interventions    Discharge    Consult Status Consult Status: Follow-up Date: 01/18/21 Follow-up type: In-patient    Rida Loudin R Lucresha Dismuke 01/18/2021, 10:26 AM

## 2021-02-18 DIAGNOSIS — Z0000 Encounter for general adult medical examination without abnormal findings: Secondary | ICD-10-CM | POA: Diagnosis not present

## 2021-02-18 DIAGNOSIS — Z13220 Encounter for screening for lipoid disorders: Secondary | ICD-10-CM | POA: Diagnosis not present

## 2022-02-19 DIAGNOSIS — L299 Pruritus, unspecified: Secondary | ICD-10-CM | POA: Diagnosis not present

## 2022-02-19 DIAGNOSIS — I872 Venous insufficiency (chronic) (peripheral): Secondary | ICD-10-CM | POA: Diagnosis not present

## 2022-02-23 DIAGNOSIS — I83893 Varicose veins of bilateral lower extremities with other complications: Secondary | ICD-10-CM | POA: Diagnosis not present

## 2022-02-24 DIAGNOSIS — I83893 Varicose veins of bilateral lower extremities with other complications: Secondary | ICD-10-CM | POA: Diagnosis not present

## 2022-02-24 DIAGNOSIS — L299 Pruritus, unspecified: Secondary | ICD-10-CM | POA: Diagnosis not present

## 2022-03-11 DIAGNOSIS — I83891 Varicose veins of right lower extremities with other complications: Secondary | ICD-10-CM | POA: Diagnosis not present

## 2022-03-23 DIAGNOSIS — H5213 Myopia, bilateral: Secondary | ICD-10-CM | POA: Diagnosis not present

## 2022-03-23 DIAGNOSIS — I87392 Chronic venous hypertension (idiopathic) with other complications of left lower extremity: Secondary | ICD-10-CM | POA: Diagnosis not present

## 2022-03-23 DIAGNOSIS — Z0100 Encounter for examination of eyes and vision without abnormal findings: Secondary | ICD-10-CM | POA: Diagnosis not present

## 2022-03-24 DIAGNOSIS — I87391 Chronic venous hypertension (idiopathic) with other complications of right lower extremity: Secondary | ICD-10-CM | POA: Diagnosis not present

## 2022-04-05 NOTE — Telephone Encounter (Signed)
No additional notes. Close encounter. 

## 2022-05-09 IMAGING — US US MFM AMNIOCENTESIS
1 series · 12 of 28 positions shown · non-contrast
Comparison: none

[Series 1: us mfm amniocentesis · 12 of 58 slices shown]
[im 3/58]
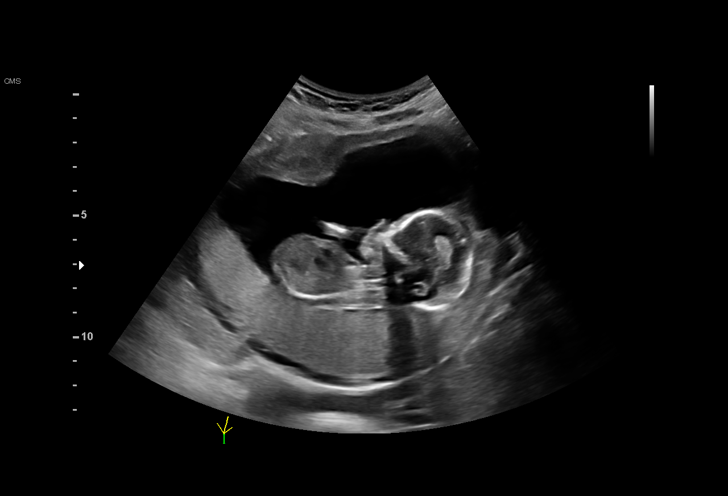
[im 7/58]
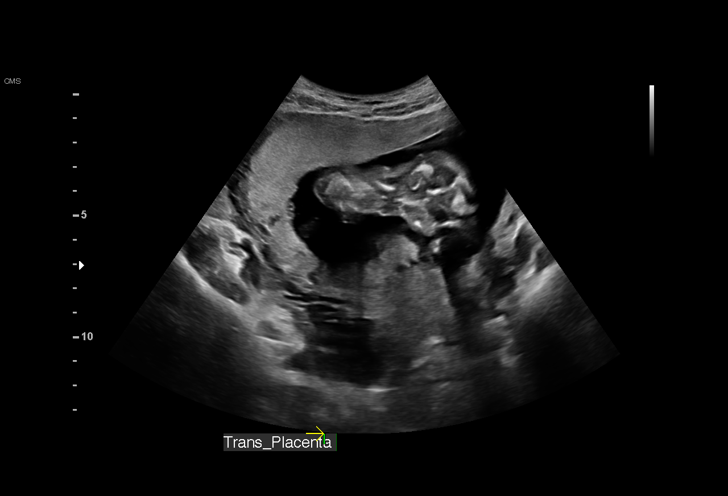
[im 11/58]
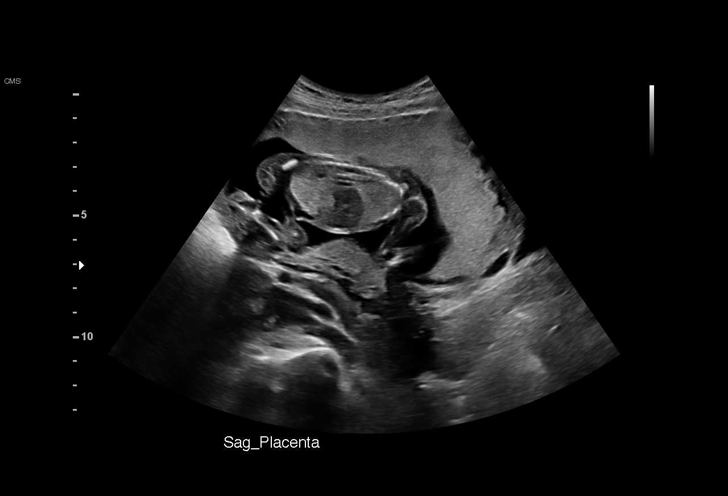
[im 17/58]
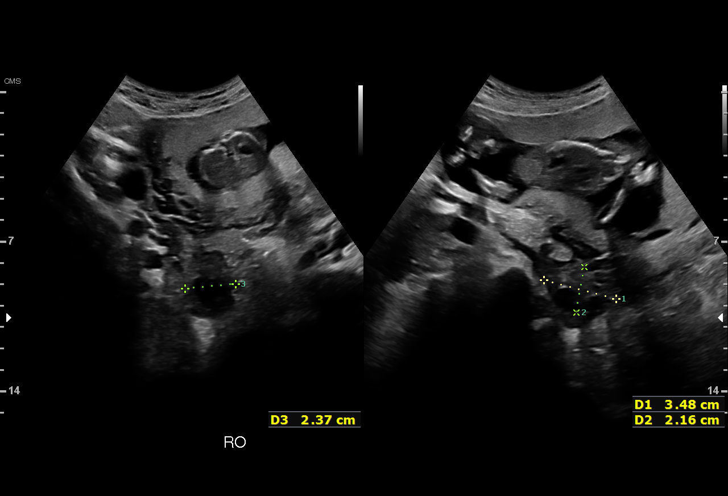
[im 22/58]
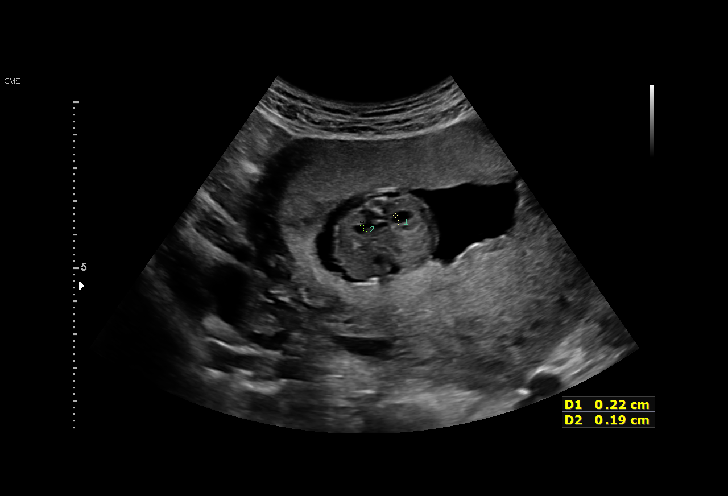
[im 26/58]
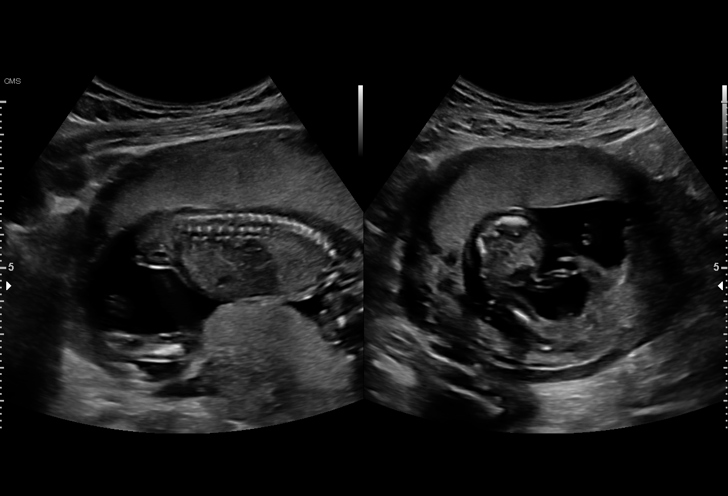
[im 32/58]
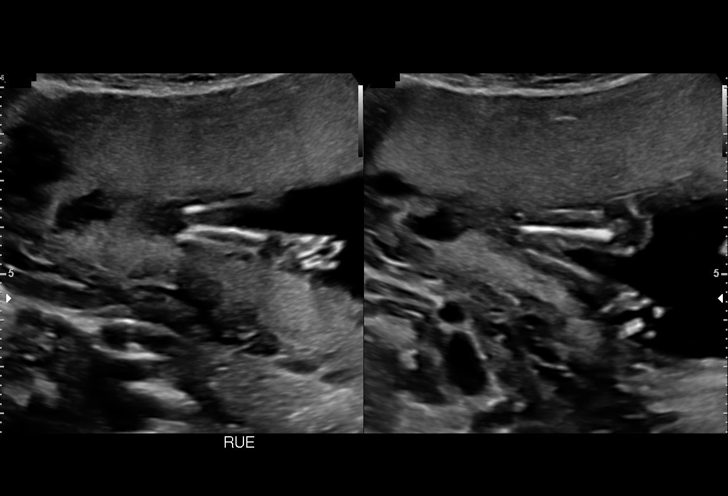
[im 36/58]
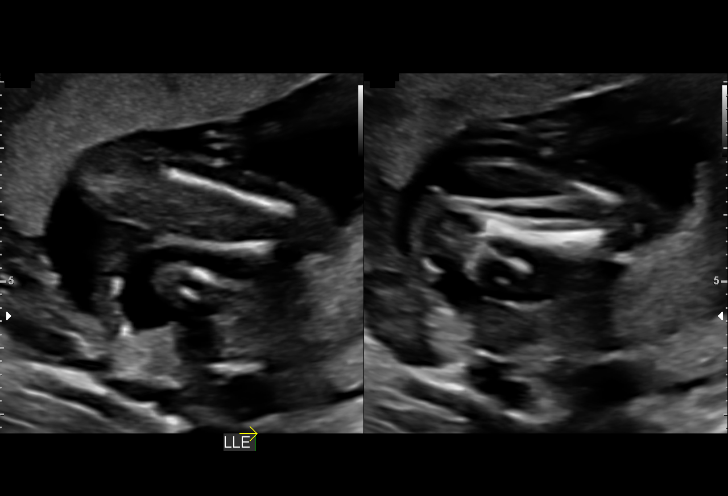
[im 41/58]
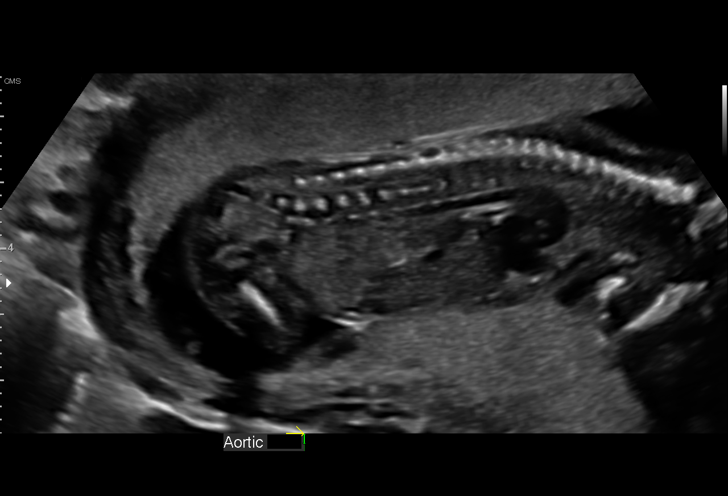
[im 47/58]
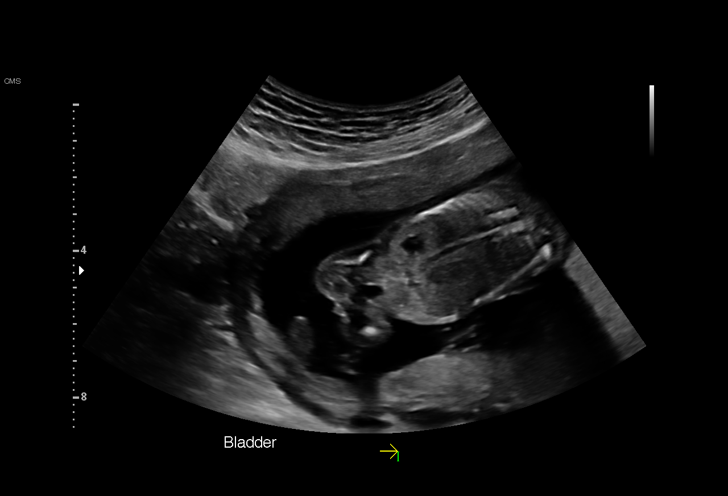
[im 51/58]
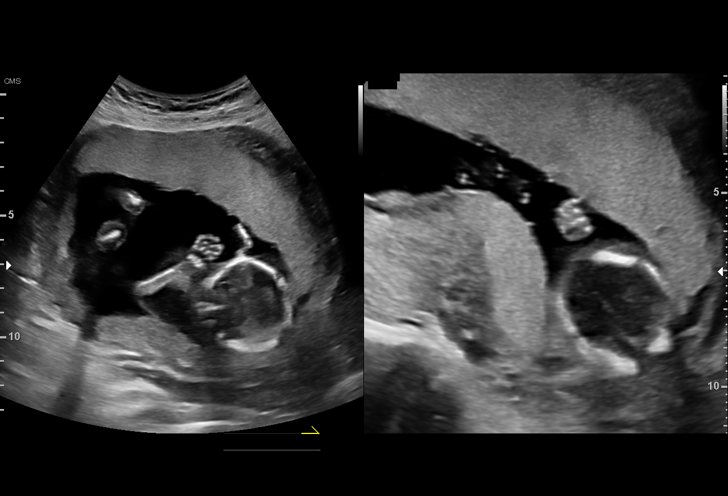
[im 55/58]
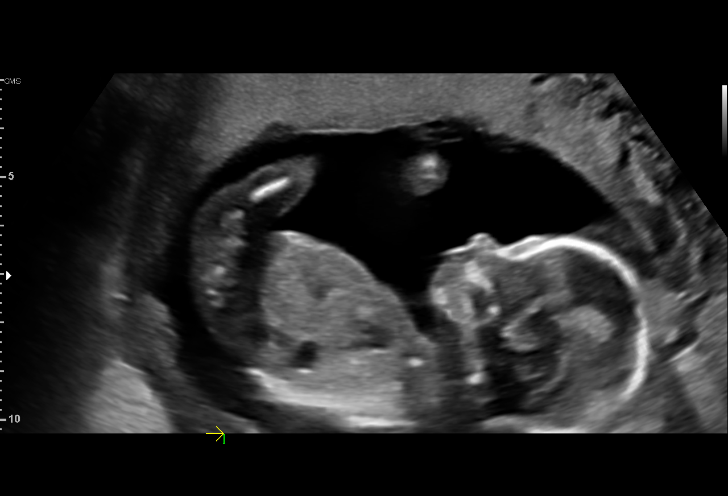

[12 of 28 positions shown; findings below may reference images not displayed]

1  US MFM AMNIOCENTESIS                  76946.01    ABDE LAKRIM JAZOULI
 2  US MFM OB LIMITED                     76815.01    ABDE LAKRIM JAZOULI

Indications

 16 weeks gestation of pregnancy
 Advanced maternal age multigravida 39,
 second trimester (low risk NIPS)
 Antenatal screening for malformations
Fetal Evaluation

 Num Of Fetuses:         1
 Fetal Heart Rate(bpm):  141
 Cardiac Activity:       Observed
 Presentation:           Variable
 Placenta:               Right lateral
 P. Cord Insertion:      Visualized

 Amniotic Fluid
 AFI FV:      Within normal limits

                             Largest Pocket(cm)

Biometry

 BPD:      35.3  mm     G. Age:  16w 6d         69  %    CI:        78.86   %    70 - 86
                                                         FL/HC:      16.5   %    13.3 -
 HC:      125.7  mm     G. Age:  16w 2d         33  %    HC/AC:      1.29        1.05 -
 AC:       97.1  mm     G. Age:  15w 6d         29  %    FL/BPD:     58.9   %
 FL:       20.8  mm     G. Age:  16w 1d         36  %    FL/AC:      21.4   %    20 - 24
 HUM:      18.9  mm     G. Age:  15w 4d         28  %
 Est. FW:     143  gm      0 lb 5 oz     20  %
OB History

 Gravidity:    2         Term:   1        Prem:   0        SAB:   0
 TOP:          0       Ectopic:  0        Living: 1
Gestational Age

 U/S Today:     16w 2d                                        EDD:   01/24/21
 Best:          16w 3d     Det. By:  Early Ultrasound         EDD:   01/23/21
                                     (06/09/20)
Anatomy

 Cranium:               Appears normal         Thoracic:               Appears normal
 Cavum:                 Appears normal         Stomach:                Appears normal, left
                                                                       sided
 Ventricles:            Appears normal         Abdomen:                Appears normal
 Choroid Plexus:        Appears normal         Abdominal Wall:         Appears nml (cord
                                                                       insert, abd wall)
 Cerebellum:            Appears normal         Cord Vessels:           Appears normal (3
                                                                       vessel cord)
 Posterior Fossa:       Appears normal         Kidneys:                Appear normal
 Nuchal Fold:           Appears normal         Bladder:                Appears normal
 Face:                  Appears normal         Spine:                  Limited views
                        (orbits and profile)                           appear normal
 Lips:                  Not well visualized    Upper Extremities:      Appears normal
 Palate:                Not well visualized    Lower Extremities:      Appears normal

 Other:  Heels and 5th digits visualized. Nasal bone visualized. Technically
         difficult due to early gestational age.
Guided Procedures

 Type:   Amniocentesis for fetal genetic study
 FH Pre Procedure:       141 bpm
 FH Post Procedure:      153 bpm
 Rh Type:                O positive
 Rh Immunoglobulin:      Not required, Rh positive
 Discharge Instructions: Post-procedure instructions given
 Needle Insertions:      20 gauge x 1
 Vol. Withdrawn:         30 ml of clear amniotic fluid
 Catheter Passes:        1 pass
 Transabdominal:         Yes
 Complications:  None
 Comment:        Informed consent was obtained. A "time-out" was
                 performed before the procedure. Patient tolerated the
                 procedure well. We gave her post-procedure instructions.
Cervix Uterus Adnexa

 Cervix
 Length:              3  cm.
 Normal appearance by transabdominal scan.
 Uterus
 No abnormality visualized.

 Right Ovary
 Within normal limits.

 Left Ovary
 Within normal limits.

 Cul De Sac
 No free fluid seen.

 Adnexa
 No abnormality visualized.
Impression

 Advanced maternal age.  On cell free fetal DNA screening,
 the risks of fetal aneuploidies including Down syndrome were
 not increased.  Patient met with our genetic counselor and
 opted for amniocentesis.
 Obstetric history is significant for a term vaginal delivery 7
 years ago.

 A limited ultrasound study was performed.  Fetal biometry is
 consistent with the previously established dates.  No obvious
 fetal anomalies are seen.  Patient is aware that detailed
 anatomical survey was not performed today and that we
 recommend anatomy scan at between 18 and 20 weeks
 gestation.

 I counseled the patient on the amniocentesis procedure and
 possible risks including miscarriage in 1 and 500 procedures.
 Patient opted to have amniocentesis.

 After informed consent, amniocentesis was performed by Dr.
 Aneurin under ultrasound guidance and 30 milliliters of clear
 amniotic fluid was withdrawn. Fluid was sent for amniotic fluid
 AFP and fetal karyotype. Cells will be saved for microarray
 analysis if the patient wishes to proceed.  Patient tolerated
 the procedure well. Post-procedure fetal heart rate was
 normal . We gave her post-procedure instructions.
 Blood was drawn for maternal cell contamination studies.
Recommendations

 -We will communicate the results to the patient and fax
 copies to your office .
                 Eusebio, Fercho

## 2023-02-01 ENCOUNTER — Other Ambulatory Visit (HOSPITAL_BASED_OUTPATIENT_CLINIC_OR_DEPARTMENT_OTHER): Payer: Self-pay

## 2023-02-01 MED ORDER — CLOBETASOL PROPIONATE 0.05 % EX SOLN
1.0000 | Freq: Two times a day (BID) | CUTANEOUS | 2 refills | Status: DC
  Filled 2023-02-01 (×2): qty 50, 25d supply, fill #0

## 2023-02-01 MED ORDER — MINOXIDIL 2.5 MG PO TABS
2.5000 mg | ORAL_TABLET | Freq: Every day | ORAL | 5 refills | Status: DC
  Filled 2023-02-01: qty 30, 30d supply, fill #0
  Filled 2023-03-09: qty 30, 30d supply, fill #1
  Filled 2023-04-01: qty 30, 30d supply, fill #2
  Filled 2023-06-10: qty 30, 30d supply, fill #0
  Filled 2023-06-10: qty 30, 30d supply, fill #3
  Filled 2023-08-15: qty 60, 60d supply, fill #1

## 2023-02-04 ENCOUNTER — Other Ambulatory Visit (HOSPITAL_BASED_OUTPATIENT_CLINIC_OR_DEPARTMENT_OTHER): Payer: Self-pay

## 2023-02-10 ENCOUNTER — Other Ambulatory Visit (HOSPITAL_BASED_OUTPATIENT_CLINIC_OR_DEPARTMENT_OTHER): Payer: Self-pay

## 2023-02-15 ENCOUNTER — Other Ambulatory Visit (HOSPITAL_BASED_OUTPATIENT_CLINIC_OR_DEPARTMENT_OTHER): Payer: Self-pay

## 2023-03-09 ENCOUNTER — Other Ambulatory Visit (HOSPITAL_BASED_OUTPATIENT_CLINIC_OR_DEPARTMENT_OTHER): Payer: Self-pay

## 2023-04-01 ENCOUNTER — Other Ambulatory Visit (HOSPITAL_BASED_OUTPATIENT_CLINIC_OR_DEPARTMENT_OTHER): Payer: Self-pay

## 2023-04-05 ENCOUNTER — Other Ambulatory Visit (HOSPITAL_BASED_OUTPATIENT_CLINIC_OR_DEPARTMENT_OTHER): Payer: Self-pay

## 2023-04-22 ENCOUNTER — Other Ambulatory Visit (HOSPITAL_BASED_OUTPATIENT_CLINIC_OR_DEPARTMENT_OTHER): Payer: Self-pay

## 2023-06-10 ENCOUNTER — Other Ambulatory Visit (HOSPITAL_BASED_OUTPATIENT_CLINIC_OR_DEPARTMENT_OTHER): Payer: Self-pay

## 2023-06-10 ENCOUNTER — Other Ambulatory Visit: Payer: Self-pay

## 2023-06-13 ENCOUNTER — Other Ambulatory Visit (HOSPITAL_BASED_OUTPATIENT_CLINIC_OR_DEPARTMENT_OTHER): Payer: Self-pay

## 2023-07-22 ENCOUNTER — Other Ambulatory Visit (HOSPITAL_BASED_OUTPATIENT_CLINIC_OR_DEPARTMENT_OTHER): Payer: Self-pay

## 2023-07-22 MED ORDER — FLULAVAL 0.5 ML IM SUSY
0.5000 mL | PREFILLED_SYRINGE | Freq: Once | INTRAMUSCULAR | 0 refills | Status: AC
  Filled 2023-07-22: qty 0.5, 1d supply, fill #0

## 2023-08-15 ENCOUNTER — Other Ambulatory Visit (HOSPITAL_BASED_OUTPATIENT_CLINIC_OR_DEPARTMENT_OTHER): Payer: Self-pay

## 2023-09-16 ENCOUNTER — Other Ambulatory Visit (HOSPITAL_BASED_OUTPATIENT_CLINIC_OR_DEPARTMENT_OTHER): Payer: Self-pay

## 2023-09-16 MED ORDER — SODIUM FLUORIDE 1.1 % DT PSTE
PASTE | DENTAL | 0 refills | Status: DC
  Filled 2023-09-16: qty 100, 30d supply, fill #0

## 2023-12-08 ENCOUNTER — Other Ambulatory Visit (HOSPITAL_BASED_OUTPATIENT_CLINIC_OR_DEPARTMENT_OTHER): Payer: Self-pay

## 2024-01-05 ENCOUNTER — Other Ambulatory Visit (HOSPITAL_BASED_OUTPATIENT_CLINIC_OR_DEPARTMENT_OTHER): Payer: Self-pay

## 2024-01-17 ENCOUNTER — Other Ambulatory Visit (HOSPITAL_BASED_OUTPATIENT_CLINIC_OR_DEPARTMENT_OTHER): Payer: Self-pay

## 2024-02-16 ENCOUNTER — Ambulatory Visit
Admission: EM | Admit: 2024-02-16 | Discharge: 2024-02-16 | Disposition: A | Discharge status: Home / Self Care | Attending: Family Medicine | Admitting: Family Medicine

## 2024-02-16 ENCOUNTER — Encounter: Payer: Self-pay | Admitting: Emergency Medicine

## 2024-02-16 DIAGNOSIS — J340 Abscess, furuncle and carbuncle of nose: Secondary | ICD-10-CM

## 2024-02-16 DIAGNOSIS — L03116 Cellulitis of left lower limb: Secondary | ICD-10-CM

## 2024-02-16 MED ORDER — DOXYCYCLINE HYCLATE 100 MG PO CAPS
100.0000 mg | ORAL_CAPSULE | Freq: Two times a day (BID) | ORAL | 0 refills | Status: AC
Start: 2024-02-16 — End: 2024-02-26

## 2024-02-16 NOTE — ED Triage Notes (Signed)
 Patient c/o a bump inside of her nose and on her left thigh.  Patient states that the bump on her hip busted on Saturday but the area has become worse.  The area is very painful, no itchy and pt just doesn't feel good.  Has applied Mupirocin.

## 2024-02-16 NOTE — ED Provider Notes (Signed)
 Samantha Trevino CARE    CSN: [id number] Arrival date & time: 02/16/24  1743      History   Chief Complaint Chief Complaint  Patient presents with   Bump on nose and left thigh x 5 days ago    HPI Samantha Trevino is a 43 y.o. female.   HPI Very pleasant 43 year old female presents with redness of nose and bump/abscess of right upper leg area.  Patient is accompanied by her husband and son this evening.  Past Medical History:  Diagnosis Date   Medical history non-contributory     Patient Active Problem List   Diagnosis Date Noted   SVD (spontaneous vaginal delivery) 01/17/2021   Encounter for induction of labor 01/16/2021   Postpartum care following vaginal delivery (10/25) 08/19/2012    Past Surgical History:  Procedure Laterality Date   DILATION AND CURETTAGE OF UTERUS     NO PAST SURGERIES      OB History     Gravida  3   Para  2   Term  2   Preterm      AB  1   Living  2      SAB  1   IAB      Ectopic      Multiple  0   Live Births  2            Home Medications    Prior to Admission medications   Medication Sig Start Date End Date Taking? Authorizing Provider  doxycycline  (VIBRAMYCIN ) 100 MG capsule Take 1 capsule (100 mg total) by mouth 2 (two) times daily for 10 days. 02/16/24 02/26/24 Yes Leonides Ramp, FNP  acetaminophen  (TYLENOL ) 325 MG tablet Take 2 tablets (650 mg total) by mouth every 4 (four) hours as needed (for pain scale < 4). 01/18/21   Terri Fester, MD  clobetasol  (TEMOVATE ) 0.05 % external solution Apply 1 Application topically 2 (two) times daily. 02/01/23     ibuprofen  (ADVIL ) 600 MG tablet Take 1 tablet (600 mg total) by mouth every 6 (six) hours. 01/18/21   Terri Fester, MD  minoxidil  (LONITEN ) 2.5 MG tablet Take 1 tablet (2.5 mg total) by mouth daily. 02/01/23     Prenatal Vit-Fe Fumarate-FA (MULTIVITAMIN-PRENATAL) 27-0.8 MG TABS Take 1 tablet by mouth daily. 08/20/12   Teresa Fender, CNM  Sodium Fluoride  1.1 % PSTE  Brush teeth as directed per instructions on tube 09/16/23       Family History Family History  Problem Relation Age of Onset   Hypertension Father    Heart disease Father     Social History Social History   Tobacco Use   Smoking status: Never   Smokeless tobacco: Former    Quit date: 08/18/2012  Vaping Use   Vaping status: Never Used  Substance Use Topics   Alcohol use: No   Drug use: No     Allergies   Patient has no known allergies.   Review of Systems Review of Systems  Skin:  Positive for rash.  All other systems reviewed and are negative.    Physical Exam Triage Vital Signs ED Triage Vitals  Encounter Vitals Group     BP      Systolic BP Percentile      Diastolic BP Percentile      Pulse      Resp      Temp      Temp src      SpO2      Weight  Height      Head Circumference      Peak Flow      Pain Score      Pain Loc      Pain Education      Exclude from Growth Chart    No data found.  Updated Vital Signs BP 122/77 (BP Location: Right Arm)   Pulse 85   Temp 99 F (37.2 C) (Oral)   Resp 16   Ht 5\' 2"  (1.575 m)   Wt 105 lb (47.6 kg)   LMP 02/11/2024 (Exact Date)   SpO2 100%   Breastfeeding No   BMI 19.20 kg/m    Physical Exam Vitals and nursing note reviewed.  Constitutional:      General: She is not in acute distress.    Appearance: Normal appearance. She is normal weight. She is not ill-appearing.  HENT:     Head: Normocephalic and atraumatic.     Mouth/Throat:     Mouth: Mucous membranes are moist.     Pharynx: Oropharynx is clear.  Eyes:     Extraocular Movements: Extraocular movements intact.     Conjunctiva/sclera: Conjunctivae normal.     Pupils: Pupils are equal, round, and reactive to light.  Cardiovascular:     Rate and Rhythm: Normal rate and regular rhythm.     Pulses: Normal pulses.     Heart sounds: Normal heart sounds.  Pulmonary:     Effort: Pulmonary effort is normal.     Breath sounds: Normal  breath sounds. No wheezing, rhonchi or rales.  Musculoskeletal:        General: Normal range of motion.     Cervical back: Normal range of motion and neck supple.  Skin:    General: Skin is warm and dry.     Comments: Distal tip of nose: Erythematous, warmth, mild soft tissue swelling-please see image below  Left upper leg (superior lateral aspect): ~1.5 cm x 1.5 cm circular shaped area of erythema, mildly indurated-please see image below  Neurological:     General: No focal deficit present.     Mental Status: She is alert and oriented to person, place, and time. Mental status is at baseline.  Psychiatric:        Mood and Affect: Mood normal.        Behavior: Behavior normal.         UC Treatments / Results  Labs (all labs ordered are listed, but only abnormal results are displayed) Labs Reviewed - No data to display  EKG   Radiology No results found.  Procedures Procedures (including critical care time)  Medications Ordered in UC Medications - No data to display  Initial Impression / Assessment and Plan / UC Course  I have reviewed the triage vital signs and the nursing notes.  Pertinent labs & imaging results that were available during my care of the patient were reviewed by me and considered in my medical decision making (see chart for details).     MDM: 1.  Cellulitis of nose-Rx'd doxycycline  100 mg capsule: Take 1 capsule twice daily x 10 days; 2.  Cellulitis of leg, left-Rx'd doxycycline  100 mg capsule: Take 1 capsule twice daily x 10 days. Advised patient to take medication as directed with food to completion.  Encouraged to increase daily water intake to 64 ounces per day while taking this medication.  Advised if symptoms worsen and/or unresolved please follow-up with your PCP or here for further evaluation.  Final Clinical Impressions(s) /  UC Diagnoses   Final diagnoses:  Cellulitis of nose  Cellulitis of leg, left     Discharge Instructions       Advised patient to take medication as directed with food to completion.  Encouraged to increase daily water intake to 64 ounces per day while taking this medication.  Advised if symptoms worsen and/or unresolved please follow-up with your PCP or here for further evaluation.     ED Prescriptions     Medication Sig Dispense Auth. Provider   doxycycline  (VIBRAMYCIN ) 100 MG capsule Take 1 capsule (100 mg total) by mouth 2 (two) times daily for 10 days. 20 capsule Flannery Cavallero, FNP      PDMP not reviewed this encounter.   Leonides Ramp, FNP 02/16/24 1904

## 2024-02-16 NOTE — Discharge Instructions (Addendum)
 Advised patient to take medication as directed with food to completion.  Encouraged to increase daily water intake to 64 ounces per day while taking this medication.  Advised if symptoms worsen and/or unresolved please follow-up with your PCP or here for further evaluation.

## 2024-02-17 ENCOUNTER — Other Ambulatory Visit (HOSPITAL_COMMUNITY): Payer: Self-pay

## 2024-02-17 ENCOUNTER — Other Ambulatory Visit (HOSPITAL_BASED_OUTPATIENT_CLINIC_OR_DEPARTMENT_OTHER): Payer: Self-pay

## 2024-02-17 MED ORDER — LINEZOLID 600 MG PO TABS
600.0000 mg | ORAL_TABLET | Freq: Two times a day (BID) | ORAL | 0 refills | Status: DC
  Filled 2024-02-17 (×2): qty 20, 10d supply, fill #0

## 2024-03-26 ENCOUNTER — Other Ambulatory Visit (HOSPITAL_BASED_OUTPATIENT_CLINIC_OR_DEPARTMENT_OTHER): Payer: Self-pay

## 2024-03-27 ENCOUNTER — Telehealth: Admitting: Emergency Medicine

## 2024-03-27 ENCOUNTER — Other Ambulatory Visit (HOSPITAL_BASED_OUTPATIENT_CLINIC_OR_DEPARTMENT_OTHER): Payer: Self-pay

## 2024-03-27 DIAGNOSIS — B3731 Acute candidiasis of vulva and vagina: Secondary | ICD-10-CM

## 2024-03-27 MED ORDER — FLUCONAZOLE 150 MG PO TABS
150.0000 mg | ORAL_TABLET | ORAL | 0 refills | Status: DC
  Filled 2024-03-27: qty 2, 6d supply, fill #0

## 2024-03-27 NOTE — Progress Notes (Signed)

## 2024-04-02 ENCOUNTER — Telehealth: Admitting: Physician Assistant

## 2024-04-02 ENCOUNTER — Encounter

## 2024-04-02 DIAGNOSIS — K610 Anal abscess: Secondary | ICD-10-CM | POA: Diagnosis not present

## 2024-04-02 DIAGNOSIS — T3695XA Adverse effect of unspecified systemic antibiotic, initial encounter: Secondary | ICD-10-CM | POA: Diagnosis not present

## 2024-04-02 DIAGNOSIS — Z8614 Personal history of Methicillin resistant Staphylococcus aureus infection: Secondary | ICD-10-CM | POA: Diagnosis not present

## 2024-04-02 DIAGNOSIS — B379 Candidiasis, unspecified: Secondary | ICD-10-CM

## 2024-04-02 MED ORDER — FLUCONAZOLE 150 MG PO TABS: 150.0000 mg | ORAL_TABLET | ORAL | 0 refills | Status: DC | PRN

## 2024-04-02 MED ORDER — MUPIROCIN 2 % EX OINT: 1.0000 | TOPICAL_OINTMENT | Freq: Two times a day (BID) | CUTANEOUS | 0 refills | Status: DC

## 2024-04-02 MED ORDER — METRONIDAZOLE 500 MG PO TABS: 500.0000 mg | ORAL_TABLET | Freq: Two times a day (BID) | ORAL | 0 refills | Status: AC

## 2024-04-02 MED ORDER — SULFAMETHOXAZOLE-TRIMETHOPRIM 800-160 MG PO TABS: 1.0000 | ORAL_TABLET | Freq: Two times a day (BID) | ORAL | 0 refills | Status: DC

## 2024-04-02 MED ORDER — HYDROCORT-PRAMOXINE (PERIANAL) 1-1 % EX FOAM: 1.0000 | Freq: Two times a day (BID) | CUTANEOUS | 0 refills | Status: DC

## 2024-04-02 NOTE — Progress Notes (Signed)
 Virtual Visit Consent   Samantha Trevino, you are scheduled for a virtual visit with a [facility] provider today. Just as with appointments in the office, your consent must be obtained to participate. Your consent will be active for this visit and any virtual visit you may have with one of our providers in the next 365 days. If you have a MyChart account, a copy of this consent can be sent to you electronically.  As this is a virtual visit, video technology does not allow for your provider to perform a traditional examination. This may limit your provider's ability to fully assess your condition. If your provider identifies any concerns that need to be evaluated in person or the need to arrange testing (such as labs, EKG, etc.), we will make arrangements to do so. Although advances in technology are sophisticated, we cannot ensure that it will always work on either your end or our end. If the connection with a video visit is poor, the visit may have to be switched to a telephone visit. With either a video or telephone visit, we are not always able to ensure that we have a secure connection.  By engaging in this virtual visit, you consent to the provision of healthcare and authorize for your insurance to be billed (if applicable) for the services provided during this visit. Depending on your insurance coverage, you may receive a charge related to this service.  I need to obtain your verbal consent now. Are you willing to proceed with your visit today? [name] has provided verbal consent on 04/02/2024 for a virtual visit (video or telephone). Angelia Kelp, PA-C  Date: 04/02/2024 7:22 PM   Virtual Visit via Video Note   I, Angelia Kelp, connected with  Samantha Trevino  ([id number], September 24, 1981) on 04/02/24 at  6:15 PM EDT by a video-enabled telemedicine application and verified that I am speaking with the correct person using two identifiers.  Location: Patient: Virtual Visit Location Patient:  Home Provider: Virtual Visit Location Provider: Home Office   I discussed the limitations of evaluation and management by telemedicine and the availability of in person appointments. The patient expressed understanding and agreed to proceed.    History of Present Illness: Samantha Trevino is a 43 y.o. who identifies as a female who was assigned female at birth, and is being seen today for a perianal abscess. Current episode started a few days ago. Completed an E-visit questionnaire on 03/27/24 thinking it may have been a yeast infection as it started with more itching and irritation. Fluconazole  was prescribed and helped some, but then the symptoms progressed. She has been trying Sitz baths and taking Tylenol  and Ibuprofen . She did try topical Lidocaine  today. It has become acutely painful today and even affecting her walking as it is so painful. She reports she feels more than one abscessed area, possibly 4. She does endorse mild feverish symptoms that have also started just today. She does have a history of MRSA, recently treated in 01/2024 with Linezolid  and Mupirocin. She reports she feels about the way she felt when she had the MRSA outbreak previously. She does have a history of hemorrhoids following her pregnancy.    Problems:  Patient Active Problem List   Diagnosis Date Noted   SVD (spontaneous vaginal delivery) 01/17/2021   Encounter for induction of labor 01/16/2021   Postpartum care following vaginal delivery (10/25) 08/19/2012    Allergies: No Known Allergies Medications:  Current Outpatient Medications:    fluconazole  (DIFLUCAN )  150 MG tablet, Take 1 tablet (150 mg total) by mouth every 3 (three) days as needed., Disp: 3 tablet, Rfl: 0   hydrocortisone-pramoxine (PROCTOFOAM-HC) rectal foam, Place 1 applicator rectally 2 (two) times daily., Disp: 10 g, Rfl: 0   metroNIDAZOLE (FLAGYL) 500 MG tablet, Take 1 tablet (500 mg total) by mouth 2 (two) times daily for 7 days., Disp: 14 tablet,  Rfl: 0   mupirocin ointment (BACTROBAN) 2 %, Apply 1 Application topically 2 (two) times daily., Disp: 22 g, Rfl: 0   sulfamethoxazole-trimethoprim (BACTRIM DS) 800-160 MG tablet, Take 1 tablet by mouth 2 (two) times daily., Disp: 14 tablet, Rfl: 0   acetaminophen  (TYLENOL ) 325 MG tablet, Take 2 tablets (650 mg total) by mouth every 4 (four) hours as needed (for pain scale < 4)., Disp: , Rfl:    clobetasol  (TEMOVATE ) 0.05 % external solution, Apply 1 Application topically 2 (two) times daily., Disp: 50 mL, Rfl: 2   ibuprofen  (ADVIL ) 600 MG tablet, Take 1 tablet (600 mg total) by mouth every 6 (six) hours., Disp: 30 tablet, Rfl: 0   minoxidil  (LONITEN ) 2.5 MG tablet, Take 1 tablet (2.5 mg total) by mouth daily., Disp: 30 tablet, Rfl: 5   Prenatal Vit-Fe Fumarate-FA (MULTIVITAMIN-PRENATAL) 27-0.8 MG TABS, Take 1 tablet by mouth daily., Disp: 30 each, Rfl:    Sodium Fluoride  1.1 % PSTE, Brush teeth as directed per instructions on tube, Disp: 100 mL, Rfl: 0  Observations/Objective: Patient is well-developed, well-nourished in no acute distress.  Resting comfortably at home.  Head is normocephalic, atraumatic.  No labored breathing.  Speech is clear and coherent with logical content.  Patient is alert and oriented at baseline.    Assessment and Plan: 1. Perianal abscess (Primary) - sulfamethoxazole-trimethoprim (BACTRIM DS) 800-160 MG tablet; Take 1 tablet by mouth 2 (two) times daily.  Dispense: 14 tablet; Refill: 0 - mupirocin ointment (BACTROBAN) 2 %; Apply 1 Application topically 2 (two) times daily.  Dispense: 22 g; Refill: 0 - metroNIDAZOLE (FLAGYL) 500 MG tablet; Take 1 tablet (500 mg total) by mouth 2 (two) times daily for 7 days.  Dispense: 14 tablet; Refill: 0 - hydrocortisone-pramoxine (PROCTOFOAM-HC) rectal foam; Place 1 applicator rectally 2 (two) times daily.  Dispense: 10 g; Refill: 0  2. Antibiotic-induced yeast infection - fluconazole  (DIFLUCAN ) 150 MG tablet; Take 1 tablet  (150 mg total) by mouth every 3 (three) days as needed.  Dispense: 3 tablet; Refill: 0  3. History of MRSA infection - mupirocin ointment (BACTROBAN) 2 %; Apply 1 Application topically 2 (two) times daily.  Dispense: 22 g; Refill: 0  - Discussed options for treatment and patient desires to try oral antibiotics initially - Will prescribe Bactrim for potential MRSA and Metronidazole for anaerobic coverage - Have given Mupirocin ointment to treat her nose for being a MRSA carrier hopefully to lessen any future outbreak - Proctofoam HC provided for pain relief and help with inflammation around the abscesses - Advised to not try to pop the abscesses on her own - Warm compresses and SITZ baths or epsom salt soaks - Stool softener if needed, do not strain for a BM - Diflucan  given as prophylaxis as patient tends to get vaginal yeast infections with antibiotic use. - Strict precautions to be seen in person if symptoms worsen at all, develops worse fever, chills, severe headache, dizziness, lightheadedness, confusion, nausea, or vomiting (systemic signs of infection), or if symptoms have not improved within 48-72 hours of the antibiotics for evaluation and consideration of I&D,  she voiced understanding  Follow Up Instructions: I discussed the assessment and treatment plan with the patient. The patient was provided an opportunity to ask questions and all were answered. The patient agreed with the plan and demonstrated an understanding of the instructions.  A copy of instructions were sent to the patient via MyChart unless otherwise noted below.    The patient was advised to call back or seek an in-person evaluation if the symptoms worsen or if the condition fails to improve as anticipated.    Angelia Kelp, PA-C

## 2024-04-02 NOTE — Patient Instructions (Signed)
 Samantha Trevino, thank you for joining Angelia Kelp, PA-C for today's virtual visit.  While this provider is not your primary care provider (PCP), if your PCP is located in our provider database this encounter information will be shared with them immediately following your visit.   A [facility] MyChart account gives you access to today's visit and all your visits, tests, and labs performed at [facility] " click here if you don't have a [facility] MyChart account or go to [email or web address]  Consent: (Patient) Samantha Trevino provided verbal consent for this virtual visit at the beginning of the encounter.  Current Medications:  Current Outpatient Medications:    fluconazole  (DIFLUCAN ) 150 MG tablet, Take 1 tablet (150 mg total) by mouth every 3 (three) days as needed., Disp: 3 tablet, Rfl: 0   hydrocortisone-pramoxine (PROCTOFOAM-HC) rectal foam, Place 1 applicator rectally 2 (two) times daily., Disp: 10 g, Rfl: 0   metroNIDAZOLE (FLAGYL) 500 MG tablet, Take 1 tablet (500 mg total) by mouth 2 (two) times daily for 7 days., Disp: 14 tablet, Rfl: 0   mupirocin ointment (BACTROBAN) 2 %, Apply 1 Application topically 2 (two) times daily., Disp: 22 g, Rfl: 0   sulfamethoxazole-trimethoprim (BACTRIM DS) 800-160 MG tablet, Take 1 tablet by mouth 2 (two) times daily., Disp: 14 tablet, Rfl: 0   acetaminophen  (TYLENOL ) 325 MG tablet, Take 2 tablets (650 mg total) by mouth every 4 (four) hours as needed (for pain scale < 4)., Disp: , Rfl:    clobetasol  (TEMOVATE ) 0.05 % external solution, Apply 1 Application topically 2 (two) times daily., Disp: 50 mL, Rfl: 2   ibuprofen  (ADVIL ) 600 MG tablet, Take 1 tablet (600 mg total) by mouth every 6 (six) hours., Disp: 30 tablet, Rfl: 0   minoxidil  (LONITEN ) 2.5 MG tablet, Take 1 tablet (2.5 mg total) by mouth daily., Disp: 30 tablet, Rfl: 5   Prenatal Vit-Fe Fumarate-FA (MULTIVITAMIN-PRENATAL) 27-0.8 MG TABS, Take 1 tablet by mouth daily.,  Disp: 30 each, Rfl:    Sodium Fluoride  1.1 % PSTE, Brush teeth as directed per instructions on tube, Disp: 100 mL, Rfl: 0   Medications ordered in this encounter:  Meds ordered this encounter  Medications   sulfamethoxazole-trimethoprim (BACTRIM DS) 800-160 MG tablet    Sig: Take 1 tablet by mouth 2 (two) times daily.    Dispense:  14 tablet    Refill:  0    Supervising Provider:   Corine Dice [[id number]]   mupirocin ointment (BACTROBAN) 2 %    Sig: Apply 1 Application topically 2 (two) times daily.    Dispense:  22 g    Refill:  0    Supervising Provider:   Corine Dice [[id number]]   metroNIDAZOLE (FLAGYL) 500 MG tablet    Sig: Take 1 tablet (500 mg total) by mouth 2 (two) times daily for 7 days.    Dispense:  14 tablet    Refill:  0    Supervising Provider:   LAMPTEY, PHILIP O [id number]   hydrocortisone-pramoxine (PROCTOFOAM-HC) rectal foam    Sig: Place 1 applicator rectally 2 (two) times daily.    Dispense:  10 g    Refill:  0    Supervising Provider:   Corine Dice [[id number]]   fluconazole  (DIFLUCAN ) 150 MG tablet    Sig: Take 1 tablet (150 mg total) by mouth every 3 (three) days as needed.    Dispense:  3 tablet    Refill:  0    Supervising Provider:  Corine Dice [[id number]]     *If you need refills on other medications prior to your next appointment, please contact your pharmacy*  Follow-Up: Call back or seek an in-person evaluation if the symptoms worsen or if the condition fails to improve as anticipated.  [facility] [phone]  Other Instructions  Anorectal Abscess An abscess is an infected area that contains pus. An anorectal abscess is an abscess that forms near the opening of the butt (anus) or around the rectum. If it is not treated, the abscess can grow and cause other problems. These problems may include a more severe body-wide infection or pain, especially when you poop. What are the causes? An anorectal abscess is caused  by clogged glands or an infection in: The anus. The area between the anus and the scrotum in males or between the anus and the vagina in females (perineum). What increases the risk? You may be more likely to get this condition if: You are pregnant. You have diabetes or an inflammatory bowel disease, such as Crohn's disease. You have had anal sex. You have certain cancers. These include rectal cancer, leukemia, or lymphoma. You have been given medicines to kill cancer cells (chemotherapy). You have cracks in your anus (anal fissures). These cracks may form if you have constipation that lasts for a long time or does not go away. You have a sexually transmitted infection (STI). What are the signs or symptoms? The main symptom of this condition is pain. It may be a throbbing pain that gets worse when you poop. Other symptoms include: Swelling, redness, and pus near the anus. The redness may go beyond the abscess and look like a red streak on the skin. A painful lump or tissue near the anus. Bleeding or pus-like discharge from the area. Fever or night sweats. Weakness and tiredness (fatigue). Constipation or pain when pooping. Pain in the lower part of your stomach. How is this diagnosed? This condition is diagnosed based on your medical history and a physical exam. The exam may include: A digital rectal exam. This is when your health care provider puts a gloved finger into your anus and rectum to look for problems. A vaginal exam. This is when your provider looks at the vagina for problems. Using a tube with a light and camera on the end (scope) to look at the rectum. You may also need tests, such as an MRI or CT scan. How is this treated? Treatment for this condition may include: Incision and drainage of the abscess. This is when an incision is made over the abscess to drain the pus. Medicines. These may include pain medicines, medicine to help you poop (laxatives), stool softeners, or  antibiotics. Follow these instructions at home: Medicines Take over-the-counter and prescription medicines only as told by your provider. If you were prescribed antibiotics, use them as told by your provider. Do not stop using the antibiotic even if you start to feel better. Ask your provider if the medicine prescribed to you requires you to avoid driving or using machinery. Wound care  Follow instructions from your provider about how to take care of your wound. Make sure you: Wash your hands with soap and water for at least 20 seconds before and after you change your bandage (dressing). If soap and water are not available, use hand sanitizer. Change your dressing as told by your provider. Leave stitches (sutures), skin glue, or tape strips in place. These skin closures may need to stay in  place for 2 weeks or longer. If tape strip edges start to loosen and curl up, you may trim the loose edges. Do not remove tape strips completely unless your provider tells you to do that. If one or more tubes (drains) were placed to drain the pus, be careful not to pull at them. Your provider will tell you how long they need to stay in place. Check your abscess area every day for signs of infection. Check for: More redness, swelling, or pain. More fluid or blood. Warmth. Pus or a bad smell. Managing pain, stiffness, and swelling  To help with pain, try sitting: On a heating pad with the setting on low. On an inflatable donut-shaped cushion. If told, put ice on the affected area. Put ice in a plastic bag. Place a towel between your skin and the bag. Leave the ice on for 20 minutes, 2-3 times a day. If your skin turns bright red, remove the ice right away to prevent skin damage. The risk of damage is higher if you cannot feel pain, heat, or cold. General instructions Take a sitz bath 3-4 times a day and after you poop. A sitz bath is a shallow, warm water bath that you sit down in. The water should only  come up to your hips and should cover your butt. Follow instructions from your provider about what you may eat and drink. Keep all follow-up visits. Your provider may need to remove stitches and make sure that the abscess has gone away. Where to find more information American Society of Colon [employer] Rectal [employer]: fascrs.org Contact a health care provider if: You have any signs of an infection. You have a fever or chills. You have trouble pooping. Get help right away if: You have trouble moving or using your legs. You have severe pain or pain that gets worse. You have swelling that gets worse. You have a lot more bleeding or passing of pus. This information is not intended to replace advice given to you by your health care provider. Make sure you discuss any questions you have with your health care provider. Document Revised: 06/02/2022 Document Reviewed: 06/02/2022 Elsevier Patient Education  2024 [employer]vier Inc.   If you have been instructed to have an in-person evaluation today at a local Urgent Care facility, please use the link below. It will take you to a list of all of our available [facility], including address, phone number and hours of operation. Please do not delay care.  [facility]  If you or a family member do not have a primary care provider, use the link below to schedule a visit and establish care. When you choose a [facility] primary care physician or advanced practice provider, you gain a long-term partner in health. Find a Primary Care Provider  Learn more about [facility]'s in-office and virtual care options:  - Get Care Now

## 2024-04-04 DIAGNOSIS — K6289 Other specified diseases of anus and rectum: Secondary | ICD-10-CM | POA: Diagnosis not present

## 2024-05-01 ENCOUNTER — Other Ambulatory Visit (HOSPITAL_BASED_OUTPATIENT_CLINIC_OR_DEPARTMENT_OTHER): Payer: Self-pay

## 2024-05-01 DIAGNOSIS — Z1231 Encounter for screening mammogram for malignant neoplasm of breast: Secondary | ICD-10-CM | POA: Diagnosis not present

## 2024-05-01 DIAGNOSIS — Z113 Encounter for screening for infections with a predominantly sexual mode of transmission: Secondary | ICD-10-CM | POA: Diagnosis not present

## 2024-05-01 DIAGNOSIS — K921 Melena: Secondary | ICD-10-CM | POA: Diagnosis not present

## 2024-05-01 DIAGNOSIS — Z309 Encounter for contraceptive management, unspecified: Secondary | ICD-10-CM | POA: Diagnosis not present

## 2024-05-01 DIAGNOSIS — R634 Abnormal weight loss: Secondary | ICD-10-CM | POA: Diagnosis not present

## 2024-05-01 DIAGNOSIS — Z01419 Encounter for gynecological examination (general) (routine) without abnormal findings: Secondary | ICD-10-CM | POA: Diagnosis not present

## 2024-05-01 DIAGNOSIS — Z124 Encounter for screening for malignant neoplasm of cervix: Secondary | ICD-10-CM | POA: Diagnosis not present

## 2024-05-01 DIAGNOSIS — Z1331 Encounter for screening for depression: Secondary | ICD-10-CM | POA: Diagnosis not present

## 2024-05-01 DIAGNOSIS — F419 Anxiety disorder, unspecified: Secondary | ICD-10-CM | POA: Diagnosis not present

## 2024-05-01 DIAGNOSIS — Z01411 Encounter for gynecological examination (general) (routine) with abnormal findings: Secondary | ICD-10-CM | POA: Diagnosis not present

## 2024-05-01 LAB — HM MAMMOGRAPHY

## 2024-05-02 ENCOUNTER — Other Ambulatory Visit (HOSPITAL_BASED_OUTPATIENT_CLINIC_OR_DEPARTMENT_OTHER): Payer: Self-pay

## 2024-05-02 LAB — LAB REPORT - SCANNED
A1c: 5
EGFR: 115
Free T4: 1.24 ng/dL
TSH: 1.78 (ref 0.41–5.90)

## 2024-05-04 ENCOUNTER — Other Ambulatory Visit (HOSPITAL_BASED_OUTPATIENT_CLINIC_OR_DEPARTMENT_OTHER): Payer: Self-pay

## 2024-05-04 LAB — HM PAP SMEAR: HPV, high-risk: NEGATIVE

## 2024-05-04 MED ORDER — NORETHIN ACE-ETH ESTRAD-FE 1-20 MG-MCG PO TABS
1.0000 | ORAL_TABLET | Freq: Every day | ORAL | 3 refills | Status: DC
  Filled 2024-05-04: qty 84, 84d supply, fill #0

## 2024-06-14 ENCOUNTER — Encounter: Payer: Self-pay | Admitting: Physician Assistant

## 2024-06-19 ENCOUNTER — Encounter

## 2024-06-21 ENCOUNTER — Other Ambulatory Visit (HOSPITAL_BASED_OUTPATIENT_CLINIC_OR_DEPARTMENT_OTHER): Payer: Self-pay

## 2024-06-21 ENCOUNTER — Encounter (HOSPITAL_BASED_OUTPATIENT_CLINIC_OR_DEPARTMENT_OTHER): Payer: Self-pay | Admitting: Family Medicine

## 2024-06-21 ENCOUNTER — Ambulatory Visit (HOSPITAL_BASED_OUTPATIENT_CLINIC_OR_DEPARTMENT_OTHER): Admitting: Family Medicine

## 2024-06-21 ENCOUNTER — Other Ambulatory Visit: Payer: Self-pay

## 2024-06-21 ENCOUNTER — Other Ambulatory Visit (HOSPITAL_BASED_OUTPATIENT_CLINIC_OR_DEPARTMENT_OTHER): Payer: Self-pay | Admitting: Family Medicine

## 2024-06-21 VITALS — BP 118/80 | HR 74 | Ht 62.0 in | Wt 107.8 lb

## 2024-06-21 DIAGNOSIS — L089 Local infection of the skin and subcutaneous tissue, unspecified: Secondary | ICD-10-CM | POA: Insufficient documentation

## 2024-06-21 DIAGNOSIS — L639 Alopecia areata, unspecified: Secondary | ICD-10-CM | POA: Insufficient documentation

## 2024-06-21 DIAGNOSIS — E559 Vitamin D deficiency, unspecified: Secondary | ICD-10-CM | POA: Diagnosis not present

## 2024-06-21 DIAGNOSIS — H906 Mixed conductive and sensorineural hearing loss, bilateral: Secondary | ICD-10-CM | POA: Insufficient documentation

## 2024-06-21 DIAGNOSIS — E538 Deficiency of other specified B group vitamins: Secondary | ICD-10-CM | POA: Diagnosis not present

## 2024-06-21 DIAGNOSIS — Z0000 Encounter for general adult medical examination without abnormal findings: Secondary | ICD-10-CM

## 2024-06-21 DIAGNOSIS — Z13220 Encounter for screening for lipoid disorders: Secondary | ICD-10-CM | POA: Diagnosis not present

## 2024-06-21 MED ORDER — MINOXIDIL 2.5 MG PO TABS
2.5000 mg | ORAL_TABLET | Freq: Every day | ORAL | 5 refills | Status: AC
  Filled 2024-06-21: qty 30, 30d supply, fill #0
  Filled 2024-07-26: qty 90, 90d supply, fill #1
  Filled 2024-10-24: qty 60, 60d supply, fill #2

## 2024-06-21 MED ORDER — TRETINOIN 0.1 % EX CREA
TOPICAL_CREAM | CUTANEOUS | 3 refills | Status: AC
  Filled 2024-06-21: qty 45, 30d supply, fill #0
  Filled 2024-10-24: qty 45, 30d supply, fill #1

## 2024-06-21 MED ORDER — CLINDAMYCIN PHOS (TWICE-DAILY) 1 % EX GEL
1.0000 | Freq: Two times a day (BID) | CUTANEOUS | 5 refills | Status: AC
  Filled 2024-06-21 – 2024-07-03 (×2): qty 30, 15d supply, fill #0
  Filled 2024-10-24: qty 30, 15d supply, fill #1

## 2024-06-21 NOTE — Progress Notes (Signed)
 Subjective:   Samantha Trevino December 13, 1980  06/21/2024   CC: Chief Complaint  Patient presents with   New Patient (Initial Visit)    New Patient Visit also wants to know if she can get a physical before Sept.     HPI: Samantha Trevino is a 43 y.o. female who presents for a routine health maintenance exam.  Labs collected at time of visit.   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  She is currently seen by dermatology and has been managed by her OB/GYN as well.  She recently had lab work as hair loss began to become diffuse instead of in patches.  She states her ferritin was slightly low and she has started taking an iron supplement to help.  She would like to know if hormones are playing a factor into this and would like hormone levels checked today.  She is currently taking minoxidil  and states that this is help with hair growth in the past.  Requesting refill today   Recurrent abscess: Patient states she has been having recurring abscesses in the perirectal, vaginal, and facial areas.  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  She states that these areas are painful, and and drained frequently.  She is concern for possible autoimmune comorbidity with hydradenitis suppurativa.  She does not use any topical creams or therapies at this time for treatment.  She is interested in preventative.  Her diet is well-controlled and she does exercise regularly.  Reports that recurring abscesses are in areas that she does not shave in.    HEARING CONCERNS:  Patient concerned regarding progressive hearing loss over the past several years.  She states that her coworkers will have to repeat things as she does not hear them the first time they are spoken to her.  She reports a family history of early onset of hearing loss.  She has never had a formal evaluation.  HEALTH SCREENINGS: - Vision Screening: up to date - Dental Visits:  up to date - Pap smear: Completed with [facility]  - Breast Exam: up to date - STD Screening: Declined - Mammogram (40+): Completed with OBGYN   - Colonoscopy (45+): Not applicable  - Bone Density (65+ or under 65 with predisposing conditions): Not applicable  - Lung CA screening with low-dose CT:  Not applicable Adults age 9-80 who are current cigarette smokers or quit within the last 15 years. Must have 20 pack year history.   Depression and Anxiety Screen done today and results listed below:     06/21/2024    9:52 AM  Depression screen PHQ 2/9  Decreased Interest 0  Down, Depressed, Hopeless 0  PHQ - 2 Score 0    IMMUNIZATIONS: - Tdap: Tetanus vaccination status reviewed: last tetanus booster within 10 years. - HPV: UTD per patient  - Influenza: Postponed to flu season - Pneumovax: Not applicable - Prevnar 20: Not applicable - Shingrix (50+): Not applicable   Past medical history, surgical history, medications, allergies, family history and social history reviewed with patient today and changes made to appropriate areas of the chart.   Past Medical History:  Diagnosis Date   Medical history non-contributory     Past Surgical History:  Procedure Laterality Date   DILATION AND CURETTAGE OF UTERUS     NO PAST SURGERIES      Current Outpatient Medications on File Prior to Visit  Medication Sig  Ferrous Sulfate (IRON SUPPLEMENT PO) Take by mouth.   tretinoin  (RETIN-A ) 0.1 % cream Apply 0.1 Applications topically at bedtime.   No current facility-administered medications on file prior to visit.    No Known Allergies   Social History   Socioeconomic History   Marital status: Married    Spouse name: Not on file   Number of children: Not on file   Years of education: Not on file   Highest education level: Doctorate  Occupational History   Not on file  Tobacco Use   Smoking status: Never   Smokeless tobacco: Former    Quit date: 08/18/2012  Vaping Use    Vaping status: Never Used  Substance and Sexual Activity   Alcohol use: No   Drug use: No   Sexual activity: Yes    Birth control/protection: None  Other Topics Concern   Not on file  Social History Narrative   Not on file   Social Drivers of Health   Financial Resource Strain: Low Risk  (06/20/2024)   Overall Financial Resource Strain (CARDIA)    Difficulty of Paying Living Expenses: Not hard at all  Food Insecurity: No Food Insecurity (06/20/2024)   Hunger Vital Sign    Worried About Running Out of Food in the Last Year: Never true    Ran Out of Food in the Last Year: Never true  Transportation Needs: No Transportation Needs (06/20/2024)   PRAPARE - Administrator, Civil Service (Medical): No    Lack of Transportation (Non-Medical): No  Physical Activity: Insufficiently Active (06/20/2024)   Exercise Vital Sign    Days of Exercise per Week: 4 days    Minutes of Exercise per Session: 30 min  Stress: No Stress Concern Present (06/20/2024)   [employer] of Occupational Health - Occupational Stress Questionnaire    Feeling of Stress: Not at all  Social Connections: Socially Integrated (06/20/2024)   Social Connection and Isolation Panel    Frequency of Communication with Friends and Family: More than three times a week    Frequency of Social Gatherings with Friends and Family: More than three times a week    Attends Religious Services: More than 4 times per year    Active Member of [employer] or Organizations: Yes    Attends Engineer, structural: More than 4 times per year    Marital Status: Married  Catering manager Violence: Not on file   Social History   Tobacco Use  Smoking Status Never  Smokeless Tobacco Former   Quit date: 08/18/2012   Social History   Substance and Sexual Activity  Alcohol Use No    Family History  Problem Relation Age of Onset   Hypertension Father    Heart disease Father      ROS: Denies fever, fatigue, unexplained  weight loss/gain, chest pain, SHOB, and palpitations. Denies neurological deficits, gastrointestinal or genitourinary complaints, and skin changes.   Objective:   Today's Vitals   06/21/24 0939  BP: 118/80  Pulse: 74  SpO2: 98%  Weight: 107 lb 12.8 oz (48.9 kg)  Height: 5' 2 (1.575 m)    GENERAL APPEARANCE: Well-appearing, in NAD. Well nourished.  SKIN: Pink, warm and dry. Turgor normal. No rash, lesion, ulceration, or ecchymoses. Hair evenly distributed.  HEENT: HEAD: Normocephalic.  EYES: PERRLA. EOMI. Lids intact w/o defect. Sclera white, Conjunctiva pink w/o exudate.  EARS: External ear w/o redness, swelling, masses or lesions. EAC clear. TM's intact, translucent w/o bulging, appropriate landmarks visualized.  Mild scarring present to right TM. appropriate acuity to conversational tones.  NOSE: Septum midline w/o deformity. Nares patent, mucosa pink and non-inflamed w/o drainage. No sinus tenderness.  THROAT: Uvula midline. Oropharynx clear. Tonsils non-inflamed w/o exudate. Oral mucosa pink and moist.  NECK: Supple, Trachea midline. Full ROM w/o pain or tenderness. No lymphadenopathy. Thyroid  non-tender w/o enlargement or palpable masses.  RESPIRATORY: Chest wall symmetrical w/o masses. Respirations even and non-labored. Breath sounds clear to auscultation bilaterally. No wheezes, rales, rhonchi, or crackles. CARDIAC: S1, S2 present, regular rate and rhythm. No gallops, murmurs, rubs, or clicks. PMI w/o lifts, heaves, or thrills. No carotid bruits. Capillary refill <2 seconds. Peripheral pulses 2+ bilaterally. GI: Abdomen soft w/o distention. Normoactive bowel sounds. No palpable masses or tenderness. No guarding or rebound tenderness. Liver and spleen w/o tenderness or enlargement. No CVA tenderness.  MSK: Muscle tone and strength appropriate for age, w/o atrophy or abnormal movement.  EXTREMITIES: Active ROM intact, w/o tenderness, crepitus, or contracture. No obvious joint  deformities or effusions. No clubbing, edema, or cyanosis.  NEUROLOGIC: CN's II-XII intact. Motor strength symmetrical with no obvious weakness. No sensory deficits. DTR's 2+ symmetric bilaterally. Steady, even gait.  PSYCH/MENTAL STATUS: Alert, oriented x 3. Cooperative, appropriate mood and affect.    Assessment & Plan:  1. Annual physical exam (Primary) Discussed preventative screenings, vaccines, and healthy lifestyle with patient. Discussed role of PCP with patient as she is establishing care. She will return to obtain fasting labs as part of AE. Will obtain records with signed release from OBGYN for pap and mammogram.   - CBC with Differential/Platelet; Future - Comprehensive metabolic panel with GFR; Future - Lipid panel; Future - TSH; Future  2. Recurrent infection of skin Discussed possible signs and symptoms of at bedtime with patient.  Recommend starting use of either Hibiclens or benzyl peroxide creamy wash with directions on how to use provided.  Will obtain ANA and sed rate per patient request to rule out possible autoimmune comorbidities.  Will start clinda gel to use as needed when abscesses recur.  No current skin infection present for PCP to examine.  Referral placed to dermatology for patient to establish care within the [facility] system. - Ambulatory referral to Dermatology - ANA; Future - Sedimentation rate; Future  3. Screening for lipid disorders Will obtain lipid panel with lab work when fasting.  4. Vitamin D deficiency Patient reports history of vitamin D deficiency.  Will check with lab work. - VITAMIN D 25 Hydroxy (Vit-D Deficiency, Fractures); Future  5. B12 deficiency Reports history of vitamin B12 deficiency.  Will check with lab work - Vitamin B12; Future  6. Alopecia areata Discussed triggers for alopecia areata and will obtain FSH, LH and estradiol  per patient request.  Will continue minoxidil  as directed. 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  Whisper test indicated worsened hearing on right ear by PCP.  Patient requesting formal evaluation and referral placed to audiology. - Ambulatory referral to Audiology   Orders Placed This Encounter  Procedures   CBC with Differential/Platelet    Standing Status:   Future    Expiration Date:   06/21/2025   Comprehensive metabolic panel with GFR    Standing Status:   Future    Expiration Date:   06/21/2025   Lipid panel    Standing Status:   Future  Expiration Date:   06/21/2025   ANA    Standing Status:   Future    Expiration Date:   06/21/2025   Sedimentation rate    Standing Status:   Future    Expiration Date:   06/21/2025   TSH    Standing Status:   Future    Expiration Date:   06/21/2025   VITAMIN D 25 Hydroxy (Vit-D Deficiency, Fractures)    Standing Status:   Future    Expiration Date:   06/21/2025   Vitamin B12    Standing Status:   Future    Expiration Date:   06/21/2025   FSH/LH    Standing Status:   Future    Expiration Date:   06/21/2025   Estradiol     Standing Status:   Future    Expiration Date:   06/21/2025   Ambulatory referral to Dermatology    Referral Priority:   Routine    Referral Type:   Consultation    Referral Reason:   Specialty Services Required    Requested Specialty:   Dermatology    Number of Visits Requested:   1   Ambulatory referral to Audiology    Referral Priority:   Routine    Referral Type:   Audiology Exam    Referral Reason:   Specialty Services Required    Number of Visits Requested:   1    PATIENT COUNSELING:  - Encouraged a healthy well-balanced diet. Patient may adjust caloric intake to maintain or achieve ideal body weight. May reduce intake of dietary saturated fat and total fat and have adequate dietary potassium and calcium preferably from fresh fruits, vegetables, and low-fat dairy  products.   - Advised to avoid cigarette smoking. - Discussed with the patient that most people either abstain from alcohol or drink within safe limits (<=14/week and <=4 drinks/occasion for males, <=7/weeks and <= 3 drinks/occasion for females) and that the risk for alcohol disorders and other health effects rises proportionally with the number of drinks per week and how often a drinker exceeds daily limits. - Discussed cessation/primary prevention of drug use and availability of treatment for abuse.  - Discussed sexually transmitted diseases, avoidance of unintended pregnancy and contraceptive alternatives.  - Stressed the importance of regular exercise - Injury prevention: Discussed safety belts, safety helmets, smoke detector, smoking near bedding or upholstery.  - Dental health: Discussed importance of regular tooth brushing, flossing, and dental visits.   NEXT PREVENTATIVE PHYSICAL DUE IN 1 YEAR.  Return in about 1 year (around 06/21/2025) for ANNUAL PHYSICAL.  Patient to reach out to office if new, worrisome, or unresolved symptoms arise or if no improvement in patient's condition. Patient verbalized understanding and is agreeable to treatment plan. All questions answered to patient's satisfaction.    Thersia Schuyler Stark, [place]

## 2024-06-22 ENCOUNTER — Other Ambulatory Visit (HOSPITAL_BASED_OUTPATIENT_CLINIC_OR_DEPARTMENT_OTHER): Payer: Self-pay

## 2024-06-22 DIAGNOSIS — L089 Local infection of the skin and subcutaneous tissue, unspecified: Secondary | ICD-10-CM | POA: Diagnosis not present

## 2024-06-22 DIAGNOSIS — E559 Vitamin D deficiency, unspecified: Secondary | ICD-10-CM | POA: Diagnosis not present

## 2024-06-22 DIAGNOSIS — E538 Deficiency of other specified B group vitamins: Secondary | ICD-10-CM

## 2024-06-22 DIAGNOSIS — Z0000 Encounter for general adult medical examination without abnormal findings: Secondary | ICD-10-CM | POA: Diagnosis not present

## 2024-06-22 DIAGNOSIS — L639 Alopecia areata, unspecified: Secondary | ICD-10-CM | POA: Diagnosis not present

## 2024-06-23 LAB — CBC WITH DIFFERENTIAL/PLATELET
Basophils Absolute: 0 x10E3/uL (ref 0.0–0.2)
Basos: 1 %
EOS (ABSOLUTE): 0.4 x10E3/uL (ref 0.0–0.4)
Eos: 7 %
Hematocrit: 40.6 % (ref 34.0–46.6)
Hemoglobin: 13.4 g/dL (ref 11.1–15.9)
Immature Grans (Abs): 0 x10E3/uL (ref 0.0–0.1)
Immature Granulocytes: 0 %
Lymphocytes Absolute: 1.9 x10E3/uL (ref 0.7–3.1)
Lymphs: 35 %
MCH: 30.7 pg (ref 26.6–33.0)
MCHC: 33 g/dL (ref 31.5–35.7)
MCV: 93 fL (ref 79–97)
Monocytes Absolute: 0.4 x10E3/uL (ref 0.1–0.9)
Monocytes: 7 %
Neutrophils Absolute: 2.8 x10E3/uL (ref 1.4–7.0)
Neutrophils: 50 %
Platelets: 234 x10E3/uL (ref 150–450)
RBC: 4.37 x10E6/uL (ref 3.77–5.28)
RDW: 12.7 % (ref 11.7–15.4)
WBC: 5.4 x10E3/uL (ref 3.4–10.8)

## 2024-06-23 LAB — COMPREHENSIVE METABOLIC PANEL WITH GFR
ALT: 14 IU/L (ref 0–32)
AST: 15 IU/L (ref 0–40)
Albumin: 4.1 g/dL (ref 3.9–4.9)
Alkaline Phosphatase: 50 IU/L (ref 44–121)
BUN/Creatinine Ratio: 21 (ref 9–23)
BUN: 13 mg/dL (ref 6–24)
Bilirubin Total: 0.5 mg/dL (ref 0.0–1.2)
CO2: 18 mmol/L — ABNORMAL LOW (ref 20–29)
Calcium: 9.1 mg/dL (ref 8.7–10.2)
Chloride: 103 mmol/L (ref 96–106)
Creatinine, Ser: 0.61 mg/dL (ref 0.57–1.00)
Globulin, Total: 2.8 g/dL (ref 1.5–4.5)
Glucose: 84 mg/dL (ref 70–99)
Potassium: 4.2 mmol/L (ref 3.5–5.2)
Sodium: 136 mmol/L (ref 134–144)
Total Protein: 6.9 g/dL (ref 6.0–8.5)
eGFR: 115 mL/min/1.73 (ref >59)

## 2024-06-23 LAB — TSH: TSH: 1.36 u[IU]/mL (ref 0.450–4.500)

## 2024-06-23 LAB — LIPID PANEL
Chol/HDL Ratio: 2.8 ratio (ref 0.0–4.4)
Cholesterol, Total: 162 mg/dL (ref 100–199)
HDL: 57 mg/dL (ref >39)
LDL Chol Calc (NIH): 92 mg/dL (ref 0–99)
Triglycerides: 68 mg/dL (ref 0–149)
VLDL Cholesterol Cal: 13 mg/dL (ref 5–40)

## 2024-06-23 LAB — FSH/LH
FSH: 5.6 m[IU]/mL
LH: 13.2 m[IU]/mL

## 2024-06-23 LAB — VITAMIN D 25 HYDROXY (VIT D DEFICIENCY, FRACTURES): Vit D, 25-Hydroxy: 81.7 ng/mL (ref 30.0–100.0)

## 2024-06-23 LAB — VITAMIN B12: Vitamin B-12: 345 pg/mL (ref 232–1245)

## 2024-06-23 LAB — SEDIMENTATION RATE: Sed Rate: 2 mm/h (ref 0–32)

## 2024-06-23 LAB — ESTRADIOL: Estradiol: 152 pg/mL

## 2024-06-23 LAB — ANA: Anti Nuclear Antibody (ANA): POSITIVE — AB

## 2024-06-26 ENCOUNTER — Ambulatory Visit (HOSPITAL_BASED_OUTPATIENT_CLINIC_OR_DEPARTMENT_OTHER): Payer: Self-pay | Admitting: Family Medicine

## 2024-06-26 DIAGNOSIS — R768 Other specified abnormal immunological findings in serum: Secondary | ICD-10-CM | POA: Insufficient documentation

## 2024-06-26 NOTE — Progress Notes (Signed)
 Hi Chen,  Your electrolytes, kidney, and liver function were stable.  Your blood counts were unremarkable.  Your cholesterol is well-controlled, and your thyroid  function is stable.  Your LH and FSH are not significantly elevated at this time.  Your B12 is on the low end of normal and your vitamin D  is sufficient at 81.7.  Your estradiol  is still within the normal range and does not indicate postmenopausal status.  However, your ANA was positive which would indicate possible autoimmune component to the recurrent skin infection that you are experiencing.  Your sed rate was normal.  Given the positive ANA, I would recommend notifying dermatology of this.  Rheumatology does manage positive ANA with possible joint involvement such as rheumatological disorders of RA, psoriatic arthritis, etc.  Please let me know if you have any further questions.

## 2024-06-27 ENCOUNTER — Other Ambulatory Visit (HOSPITAL_BASED_OUTPATIENT_CLINIC_OR_DEPARTMENT_OTHER): Payer: Self-pay | Admitting: Family Medicine

## 2024-06-27 ENCOUNTER — Other Ambulatory Visit (HOSPITAL_BASED_OUTPATIENT_CLINIC_OR_DEPARTMENT_OTHER): Payer: Self-pay

## 2024-06-27 DIAGNOSIS — R768 Other specified abnormal immunological findings in serum: Secondary | ICD-10-CM

## 2024-06-27 NOTE — Telephone Encounter (Signed)
 Please see mychart message sent by pt and advise.

## 2024-06-28 ENCOUNTER — Other Ambulatory Visit (HOSPITAL_BASED_OUTPATIENT_CLINIC_OR_DEPARTMENT_OTHER): Payer: Self-pay

## 2024-06-29 ENCOUNTER — Other Ambulatory Visit (HOSPITAL_BASED_OUTPATIENT_CLINIC_OR_DEPARTMENT_OTHER): Payer: Self-pay

## 2024-06-30 ENCOUNTER — Telehealth: Admitting: Nurse Practitioner

## 2024-06-30 DIAGNOSIS — L02421 Furuncle of right axilla: Secondary | ICD-10-CM

## 2024-06-30 MED ORDER — SULFAMETHOXAZOLE-TRIMETHOPRIM 800-160 MG PO TABS: 1.0000 | ORAL_TABLET | Freq: Two times a day (BID) | ORAL | 0 refills | Status: AC

## 2024-06-30 MED ORDER — IBUPROFEN 800 MG PO TABS: 800.0000 mg | ORAL_TABLET | Freq: Three times a day (TID) | ORAL | 0 refills | Status: AC | PRN

## 2024-06-30 NOTE — Progress Notes (Signed)
 Virtual Visit Consent   Samantha Trevino, you are scheduled for a virtual visit with a  provider today. Just as with appointments in the office, your consent must be obtained to participate. Your consent will be active for this visit and any virtual visit you may have with one of our providers in the next 365 days. If you have a MyChart account, a copy of this consent can be sent to you electronically.  As this is a virtual visit, video technology does not allow for your provider to perform a traditional examination. This may limit your provider's ability to fully assess your condition. If your provider identifies any concerns that need to be evaluated in person or the need to arrange testing (such as labs, EKG, etc.), we will make arrangements to do so. Although advances in technology are sophisticated, we cannot ensure that it will always work on either your end or our end. If the connection with a video visit is poor, the visit may have to be switched to a telephone visit. With either a video or telephone visit, we are not always able to ensure that we have a secure connection.  By engaging in this virtual visit, you consent to the provision of healthcare and authorize for your insurance to be billed (if applicable) for the services provided during this visit. Depending on your insurance coverage, you may receive a charge related to this service.  I need to obtain your verbal consent now. Are you willing to proceed with your visit today? Samantha Trevino has provided verbal consent on 06/30/2024 for a virtual visit (video or telephone). Haze LELON Servant, NP  Date: 06/30/2024 10:30 AM   Virtual Visit via Video Note   I, Haze LELON Servant, connected with  Samantha Trevino  ([id number], 1981-06-21) on 06/30/24 at 10:15 AM EDT by a video-enabled telemedicine application and verified that I am speaking with the correct person using two identifiers.  Location: Patient: Virtual Visit Location Patient:  Home Provider: Virtual Visit Location Provider: Home Office   I discussed the limitations of evaluation and management by telemedicine and the availability of in person appointments. The patient expressed understanding and agreed to proceed.    History of Present Illness: Samantha Trevino is a 43 y.o. who identifies as a female who was assigned female at birth, and is being seen today for boil in right axilla.  Samantha Trevino is currently experiencing significant painful nodule in the right axilla.  Despite using Clindagel  for the past 5 days and apply warm compresses the nodule has not decreased in size.  She has a history of MRSA infection of that she was previously prescribed Bactrim  for similar symptoms.   Problems:  Patient Active Problem List   Diagnosis Date Noted   Positive ANA (antinuclear antibody) 06/26/2024   Vitamin D  deficiency 06/21/2024   B12 deficiency 06/21/2024   Alopecia areata 06/21/2024   Mixed conductive and sensorineural hearing loss of both ears 06/21/2024   Recurrent infection of skin 06/21/2024   SVD (spontaneous vaginal delivery) 01/17/2021   Encounter for induction of labor 01/16/2021   Postpartum care following vaginal delivery (10/25) 08/19/2012    Allergies: No Known Allergies Medications:  Current Outpatient Medications:    ibuprofen  (ADVIL ) 800 MG tablet, Take 1 tablet (800 mg total) by mouth every 8 (eight) hours as needed., Disp: 30 tablet, Rfl: 0   sulfamethoxazole -trimethoprim  (BACTRIM  DS) 800-160 MG tablet, Take 1 tablet by mouth 2 (two) times daily for 7 days., Disp: 14 tablet,  Rfl: 0   clindamycin  (CLINDAGEL ) 1 % gel, Apply 1 Application topically 2 (two) times daily., Disp: 30 g, Rfl: 5   Ferrous Sulfate (IRON SUPPLEMENT PO), Take by mouth., Disp: , Rfl:    minoxidil  (LONITEN ) 2.5 MG tablet, Take 1 tablet (2.5 mg total) by mouth daily., Disp: 30 tablet, Rfl: 5   tretinoin  (RETIN-A ) 0.1 % cream, Apply pea sized amount topically to face at bedtime.,  Disp: 45 g, Rfl: 3  Observations/Objective: Patient is well-developed, well-nourished in no acute distress.  Resting comfortably at home.  Head is normocephalic, atraumatic.  No labored breathing.  Speech is clear and coherent with logical content.  Patient is alert and oriented at baseline.    Assessment and Plan: 1. Furuncle of right axilla (Primary) - ibuprofen  (ADVIL ) 800 MG tablet; Take 1 tablet (800 mg total) by mouth every 8 (eight) hours as needed.  Dispense: 30 tablet; Refill: 0 - sulfamethoxazole -trimethoprim  (BACTRIM  DS) 800-160 MG tablet; Take 1 tablet by mouth 2 (two) times daily for 7 days.  Dispense: 14 tablet; Refill: 0 Continue warm compresses to affected area.  Follow Up Instructions: I discussed the assessment and treatment plan with the patient. The patient was provided an opportunity to ask questions and all were answered. The patient agreed with the plan and demonstrated an understanding of the instructions.  A copy of instructions were sent to the patient via MyChart unless otherwise noted below.    The patient was advised to call back or seek an in-person evaluation if the symptoms worsen or if the condition fails to improve as anticipated.    Shanin Szymanowski W Teancum Brule, NP

## 2024-06-30 NOTE — Patient Instructions (Signed)
  Junita Blanch, thank you for joining Haze LELON Servant, NP for today's virtual visit.  While this provider is not your primary care provider (PCP), if your PCP is located in our provider database this encounter information will be shared with them immediately following your visit.   A [facility] MyChart account gives you access to today's visit and all your visits, tests, and labs performed at [facility]  click here if you don't have a [facility] MyChart account or go to [email or web address]  Consent: (Patient) Samantha Trevino provided verbal consent for this virtual visit at the beginning of the encounter.  Current Medications:  Current Outpatient Medications:    ibuprofen  (ADVIL ) 800 MG tablet, Take 1 tablet (800 mg total) by mouth every 8 (eight) hours as needed., Disp: 30 tablet, Rfl: 0   sulfamethoxazole -trimethoprim  (BACTRIM  DS) 800-160 MG tablet, Take 1 tablet by mouth 2 (two) times daily for 7 days., Disp: 14 tablet, Rfl: 0   clindamycin  (CLINDAGEL ) 1 % gel, Apply 1 Application topically 2 (two) times daily., Disp: 30 g, Rfl: 5   Ferrous Sulfate (IRON SUPPLEMENT PO), Take by mouth., Disp: , Rfl:    minoxidil  (LONITEN ) 2.5 MG tablet, Take 1 tablet (2.5 mg total) by mouth daily., Disp: 30 tablet, Rfl: 5   tretinoin  (RETIN-A ) 0.1 % cream, Apply pea sized amount topically to face at bedtime., Disp: 45 g, Rfl: 3   Medications ordered in this encounter:  Meds ordered this encounter  Medications   ibuprofen  (ADVIL ) 800 MG tablet    Sig: Take 1 tablet (800 mg total) by mouth every 8 (eight) hours as needed.    Dispense:  30 tablet    Refill:  0    Supervising Provider:   LAMPTEY, PHILIP O [[id number]]   sulfamethoxazole -trimethoprim  (BACTRIM  DS) 800-160 MG tablet    Sig: Take 1 tablet by mouth 2 (two) times daily for 7 days.    Dispense:  14 tablet    Refill:  0    Supervising Provider:   BLAISE ALEENE KIDD [[id number]]     *If you need refills on other medications prior to  your next appointment, please contact your pharmacy*  Follow-Up: Call back or seek an in-person evaluation if the symptoms worsen or if the condition fails to improve as anticipated.  [facility] [phone]  Other Instructions   If you have been instructed to have an in-person evaluation today at a local Urgent Care facility, please use the link below. It will take you to a list of all of our available [facility], including address, phone number and hours of operation. Please do not delay care.  [facility]  If you or a family member do not have a primary care provider, use the link below to schedule a visit and establish care. When you choose a [facility] primary care physician or advanced practice provider, you gain a long-term partner in health. Find a Primary Care Provider  Learn more about 's in-office and virtual care options:  - Get Care Now

## 2024-07-02 ENCOUNTER — Encounter (HOSPITAL_BASED_OUTPATIENT_CLINIC_OR_DEPARTMENT_OTHER): Payer: Self-pay | Admitting: Family Medicine

## 2024-07-02 ENCOUNTER — Other Ambulatory Visit (HOSPITAL_BASED_OUTPATIENT_CLINIC_OR_DEPARTMENT_OTHER): Payer: Self-pay

## 2024-07-02 ENCOUNTER — Ambulatory Visit (INDEPENDENT_AMBULATORY_CARE_PROVIDER_SITE_OTHER): Admitting: Family Medicine

## 2024-07-02 VITALS — BP 106/82 | HR 98 | Ht 62.0 in | Wt 107.0 lb

## 2024-07-02 DIAGNOSIS — L0291 Cutaneous abscess, unspecified: Secondary | ICD-10-CM | POA: Diagnosis not present

## 2024-07-02 NOTE — Patient Instructions (Addendum)
 Continue Warm Compresses and complete Bactrim  as prescribed.  Pull out 1/4 in of packing daily and use clean bandage to cover

## 2024-07-02 NOTE — Progress Notes (Addendum)
 Office Visit Note  Patient: Samantha Trevino             Date of Birth: 05-Oct-1981           MRN: [id number]             PCP: Knute Thersia Bitters, FNP Referring: Knute Thersia Bitters, * Visit Date: 07/09/2024 Occupation: @GUAROCC @  Subjective:  Positive ANA and hair loss  History of Present Illness: Samantha Trevino is a 43 y.o. female seen for evaluation of positive ANA and hair loss.  According to the patient her symptoms started in 2020 when she had patchy hair loss.  She recalls being seen at [facility] and had cortisone injections with good full recovery.  She states in 2022 she again had patchy hair loss which she recovered after cortisone injections.  She gave birth to her last child in 2022.  The pregnancy and delivery was uncomplicated.  She states for the last 2 years she has been having weight loss.  She states her weight ranges anywhere from 105-108.  She states her weight has been stable in the last year.  For the last 2 months she has been noticing increased hair loss.  She states she has been under a lot of stress due to frequent folliculitis which has been happening since January 2025.  She states she has had lesions in her nose, vaginal area, anal and axillary lesions.  She states the lesions happen about every 2 months.  She seen her PCP and had labs done which showed positive ANA for that reason she was referred to me.  She states she has dealt with chronic constipation for all her life.  She also has occasional blood in her stool when she is constipated.  She has an appointment coming up with a gastroenterologist.  There is no history of diarrhea.  She is on vegetarian diet.  She does not drink enough water.  She states her diet is full in fiber.  There is no history of oral ulcers, nasal ulcers, sicca symptoms, malar rash, photosensitivity, inflammatory arthritis or lymphadenopathy.  There is no family history of lupus or related syndrome.  She has second cousins daughter who  has alopecia universalis.  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  She is married, gravida 3, para 2, 1 D&C, no history of preeclampsia or DVTs.  There is no history of alcohol use or smoking.  She walks 2 miles 3 times a week.  She states she has been under stress due to her hair loss but she does not have any other major stressors.    Activities of Daily Living:  Patient reports morning stiffness for 0 minutes.   Patient Denies nocturnal pain.  Difficulty dressing/grooming: Denies Difficulty climbing stairs: Denies Difficulty getting out of chair: Denies Difficulty using hands for taps, buttons, cutlery, and/or writing: Denies  Review of Systems  Constitutional:  Positive for fatigue and weight loss.  HENT:  Negative for mouth sores and mouth dryness.   Eyes:  Negative for dryness.  Respiratory:  Negative for shortness of breath.   Cardiovascular:  Negative for chest pain and palpitations.  Gastrointestinal:  Positive for blood in stool and constipation. Negative for diarrhea.       Scheduled with GI  Endocrine: Negative for increased urination.  Genitourinary:  Negative for involuntary urination.  Musculoskeletal:  Negative for joint pain, gait problem, joint pain, joint swelling, myalgias, muscle weakness, morning stiffness, muscle  tenderness and myalgias.  Skin:  Positive for hair loss. Negative for color change, rash and sensitivity to sunlight.  Allergic/Immunologic: Positive for susceptible to infections.  Neurological:  Negative for dizziness and headaches.  Hematological:  Negative for swollen glands.  Psychiatric/Behavioral:  Negative for depressed mood and sleep disturbance. The patient is not nervous/anxious.     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   Recurrent infection of skin 06/21/2024   SVD (spontaneous vaginal delivery) 01/17/2021   Encounter for induction of labor 01/16/2021   Postpartum care following vaginal delivery (10/25) 08/19/2012    Past Medical History:  Diagnosis Date   Alopecia areata    History of MRSA infection    Medical history non-contributory    Varicose veins of both lower extremities    Venous insufficiency     Family History  Problem Relation Age of Onset   Diabetes Mother    Thyroid  disease Mother    Other Mother        trigeminal neuralgia   Hypertension Father    Heart disease Father    Diabetes Father    Hyperlipidemia Father    Stroke Father    Healthy Sister    High Cholesterol Brother    Varicose Veins Maternal Aunt    Early death Maternal Grandmother    Early death Maternal Grandfather    Hearing loss Paternal Grandfather    Healthy Son    Healthy Daughter    Past Surgical History:  Procedure Laterality Date   DILATION AND CURETTAGE OF UTERUS     Social History   Social History Narrative   Not on file   Immunization History  Administered Date(s) Administered   DTaP 07/27/2012   Influenza Whole 08/03/2012   Influenza, Seasonal, Injecte, Preservative Fre 07/22/2023   Tdap 11/03/2020     Objective: Vital Signs: BP 99/67 (BP Location: Right Arm, Patient Position: Sitting, Cuff Size: Normal)   Pulse 81   Temp 97.9 F (36.6 C)   Resp 14   Ht 5' 2.25 (1.581 m)   Wt 108 lb 9.6 oz (49.3 kg)   LMP 07/05/2024 (Exact Date)   BMI 19.70 kg/m    Physical Exam Vitals and nursing note reviewed.  Constitutional:      Appearance: She is well-developed.  HENT:     Head: Normocephalic and atraumatic.  Eyes:     Conjunctiva/sclera: Conjunctivae normal.  Cardiovascular:     Rate and Rhythm: Normal rate and regular rhythm.     Heart sounds: Normal heart sounds.  Pulmonary:     Effort:  Pulmonary effort is normal.     Breath sounds: Normal breath sounds.  Abdominal:     General: Bowel sounds are normal.     Palpations: Abdomen is soft.  Musculoskeletal:     Cervical back: Normal range of motion.  Lymphadenopathy:     Cervical: No cervical adenopathy.  Skin:    General: Skin is warm and dry.     Capillary Refill: Capillary refill takes less than 2 seconds.  Neurological:     Mental Status: She is alert and oriented to person, place, and time.  Psychiatric:        Behavior: Behavior normal.  Musculoskeletal Exam: Cervical, thoracic and lumbar spine were in good range of motion.  There was no SI joint tenderness.  Shoulder joints, elbow joints, wrist joints, MCPs, PIPs and DIPs were in good range of motion with no synovitis.  Hip joints and knee joints were in good range of motion without any warmth swelling or effusion.  There was no tenderness over ankles or MTPs.   CDAI Exam: CDAI Score: -- Patient Global: --; Provider Global: -- Swollen: --; Tender: -- Joint Exam 07/09/2024   No joint exam has been documented for this visit   There is currently no information documented on the homunculus. Go to the Rheumatology activity and complete the homunculus joint exam.  Investigation: No additional findings.  Imaging: No results found.  Recent Labs: Lab Results  Component Value Date   WBC 5.4 06/22/2024   HGB 13.4 06/22/2024   PLT 234 06/22/2024   NA 136 06/22/2024   K 4.2 06/22/2024   CL 103 06/22/2024   CO2 18 (L) 06/22/2024   GLUCOSE 84 06/22/2024   BUN 13 06/22/2024   CREATININE 0.61 06/22/2024   BILITOT 0.5 06/22/2024   ALKPHOS 50 06/22/2024   AST 15 06/22/2024   ALT 14 06/22/2024   PROT 6.9 06/22/2024   ALBUMIN 4.1 06/22/2024   CALCIUM 9.1 06/22/2024   June 22, 2024 lipid panel normal, ANA positive, sed rate 2, TSH normal, B12 345, vitamin D81.7.  Ferritin 20 per patient in July 2025 done by her GYN.  Speciality Comments: No  specialty comments available.  Procedures:  No procedures performed Allergies: Patient has no known allergies.   Assessment / Plan:     Visit Diagnoses: Positive ANA (antinuclear antibody) -patient has positive ANA but no titer given.  She gives history of hair thinning for the last 2 months.  There is no history of oral ulcers, nasal ulcers, malar rash, photosensitivity, Raynaud's, lymphadenopathy.  No synovitis was noted on the examination.  I will obtain additional labs today.  Plan: ANA, Anti-scleroderma antibody, RNP Antibody, Anti-Smith antibody, Sjogrens syndrome-A extractable nuclear antibody, Sjogrens syndrome-B extractable nuclear antibody, Anti-DNA antibody, double-stranded, C3 and C4, Beta-2  glycoprotein antibodies, Cardiolipin antibodies, IgG, IgM, IgA, Pan-ANCA.  Patient is currently on her menstrual cycle.  She will return for protein / creatinine ratio, urine  Hair loss-gives history of increased hair loss for the last 2 months.  She denies any stressful events in the last 3 months.  However she has been having follicular lesions since January 2025 which was stressful for her.  She states that hair loss itself became stressful and causes anxiety.  She has noticed generalized hair thinning.  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  She has an appointment coming up with the dermatologist.  She is on minoxidil  2.5 mg p.o. daily.  Alopecia areata-she has a history of patchy alopecia areata in 2020 and 2022 which responded to intralesional steroid injections.  Folliculitis-she gives history of folliculitis since January 2025 which happens approximately once every 2 months.  She has had lesions inside her nose and the vaginal area, anal area, axillary lesion.  Her last lesion was in the axilla which had to be incised.  She is currently does not have any lesions although she showed me some pictures on her cell phone.  There is a concern of hidradenitis suppurativa.  She has an  appointment coming up with the dermatologist.  Recurrent infection of skin  B12 deficiency-B12 was low normal.  She is on her vegetarian diet.  I advised her to add vitamin B12 to her diet.  Vitamin D  deficiency-vitamin D  was normal at 81.7 in August.  Low ferritin-she states her ferritin was low at 20 which was done by her GYN in July.  She has not been taking iron.  Liquid iron and iron rich diet was advised.  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  She is not symptomatic.  Varicose veins of both lower extremities-she had a sclerotherapy.  She is hyperpigmentation on her lower extremities.  Use of support socks was advised.  Vegetarian diet-she is mostly in the vegetarian diet.  Protein rich diet was advised.  Chronic constipation-she can history of chronic constipation for many years.  She denies any history of diarrhea or mucus in her stool.  She occasionally gets blood in her stool.  She has an appointment coming up with gastroenterologist.  Situational anxiety-she states her anxiety initially was related to folliculitis but now it is more related to hair loss.  Exercise and relaxation was advised.  Orders: Orders Placed This Encounter  Procedures   ANA   Anti-scleroderma antibody   RNP Antibody   Anti-Smith antibody   Sjogrens syndrome-A extractable nuclear antibody   Sjogrens syndrome-B extractable nuclear antibody   Anti-DNA antibody, double-stranded   C3 and C4   Beta-2  glycoprotein antibodies   Cardiolipin antibodies, IgG, IgM, IgA   Pan-ANCA   Protein / creatinine ratio, urine   No orders of the defined types were placed in this encounter.    Follow-Up Instructions: Return for Positive ANA.   Maya Nash, MD  Note - This record has been created using Animal nutritionist.  Chart creation errors have been sought, but may not always  have been located. Such creation errors do not reflect on  the standard of medical care.

## 2024-07-02 NOTE — Progress Notes (Signed)
 Acute Care Office Visit  Subjective:   Samantha Trevino 01/27/1981 07/02/2024  Chief Complaint  Patient presents with   boil on armpit    Pt has a boil under right armpit that is very painful. States she has had them before which usually drains but this time it hasn't. Has been doing warm compresses. Was prescribed bactrim  due to thinking the place was beginning to spread.    HPI: Patient presents with abscess present to right axillae that began to form in the past week. She did a virtual visit on 06/30/2024 and was started on Bactrim . She has taken 1 full day of antibiotics without significant improvement. She attempted to drain the area without success by squeezing yesterday. Reports significant pain to right axillae. Denies fever, chills.    The following portions of the patient's history were reviewed and updated as appropriate: past medical history, past surgical history, family history, social history, allergies, medications, and problem list.   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   Recurrent infection of skin 06/21/2024   SVD (spontaneous vaginal delivery) 01/17/2021   Encounter for induction of labor 01/16/2021   Postpartum care following vaginal delivery (10/25) 08/19/2012   Past Medical History:  Diagnosis Date   Alopecia areata    History of MRSA infection    Medical history non-contributory    Varicose veins of both lower extremities    Venous insufficiency    Past Surgical History:  Procedure Laterality Date   DILATION AND CURETTAGE OF UTERUS     NO PAST SURGERIES     Family History  Problem Relation Age of Onset   Diabetes Mother    Hypertension Father    Heart disease Father    Diabetes Father     Hyperlipidemia Father    Stroke Father    Early death Maternal Grandfather    Early death Maternal Grandmother    Hearing loss Paternal Grandfather    Varicose Veins Maternal Aunt    Outpatient Medications Prior to Visit  Medication Sig Dispense Refill   clindamycin  (CLINDAGEL ) 1 % gel Apply 1 Application topically 2 (two) times daily. 30 g 5   Ferrous Sulfate (IRON SUPPLEMENT PO) Take by mouth.     ibuprofen  (ADVIL ) 800 MG tablet Take 1 tablet (800 mg total) by mouth every 8 (eight) hours as needed. 30 tablet 0   minoxidil  (LONITEN ) 2.5 MG tablet Take 1 tablet (2.5 mg total) by mouth daily. 30 tablet 5   sulfamethoxazole -trimethoprim  (BACTRIM  DS) 800-160 MG tablet Take 1 tablet by mouth 2 (two) times daily for 7 days. 14 tablet 0   tretinoin  (RETIN-A ) 0.1 % cream Apply pea sized amount topically to face at bedtime. 45 g 3   No facility-administered medications prior to visit.   No Known Allergies   ROS: A complete ROS was performed with pertinent positives/negatives noted in the HPI. The remainder of the ROS are negative.    Objective:   Today's Vitals   07/02/24 1403  BP: 106/82  Pulse: 98  SpO2: 100%  Weight: 107 lb (48.5 kg)  Height: 5' 2 (1.575 m)    GENERAL: Well-appearing, in NAD. Well nourished.  SKIN: Pink, warm and dry. Moderate sized dermal abscess present to right  axillae with erythema and tenderness.  Head: Normocephalic. NECK: Trachea midline. Full ROM w/o pain or tenderness.   RESPIRATORY: Chest wall symmetrical. Respirations even and non-labored. MSK: Muscle tone and strength appropriate for age. Full ROM to all extremities.  NEUROLOGIC: No motor or sensory deficits. Steady, even gait. C2-C12 intact.  PSYCH/MENTAL STATUS: Alert, oriented x 3. Cooperative, appropriate mood and affect.   I & D  Date/Time: 07/02/2024 2:55 PM  Performed by: Knute Thersia Bitters, FNP Authorized by: Knute Thersia Bitters, FNP   Consent:    Consent obtained:  Written    Consent given by:  Patient   Risks, benefits, and alternatives were discussed: yes     Risks discussed:  Bleeding, incomplete drainage, pain, damage to other organs and infection   Alternatives discussed:  Alternative treatment and observation Universal protocol:    Procedure explained and questions answered to patient or proxy's satisfaction: yes     Relevant documents present and verified: yes     Test results available: no     Imaging studies available: no     Required blood products, implants, devices, and special equipment available: no     Immediately prior to procedure a time out was called: yes     Patient identity confirmed:  Verbally with patient Location:    Type:  Abscess   Size:  7mm   Location:  Upper extremity   Upper extremity location: Right Axillae. Pre-procedure details:    Skin preparation:  Povidone-iodine Sedation:    Sedation type:  None Anesthesia:    Anesthesia method:  Local infiltration   Local anesthetic:  Lidocaine  1% w/o epi Procedure type:    Complexity:  Simple Procedure details:    Needle aspiration: no     Incision types:  Single straight   Incision depth:  Dermal   Scalpel blade:  10   Wound management:  Probed and deloculated and irrigated with saline   Drainage:  Purulent   Drainage amount:  Moderate   Wound treatment:  Wound left open   Packing materials:  1/4 in iodoform gauze   Amount 1/4 iodoform:  1.0 in Post-procedure details:    Procedure completion:  Tolerated well, no immediate complications    No results found for any visits on 07/02/24.    Assessment & Plan:  1. Abscess (Primary) Patient tolerated I&D of abscess well with significant purulent white drainage present. Will continue Bactrim  as prescribed for coverage and wound directions regarding packing reviewed. She verbalized understanding. Will return in 2 days for wound recheck.    Return in about 2 days (around 07/04/2024) for Wound Check .    Patient to reach out  to office if new, worrisome, or unresolved symptoms arise or if no improvement in patient's condition. Patient verbalized understanding and is agreeable to treatment plan. All questions answered to patient's satisfaction.    Thersia Bitters [place], [place]

## 2024-07-03 ENCOUNTER — Other Ambulatory Visit (HOSPITAL_BASED_OUTPATIENT_CLINIC_OR_DEPARTMENT_OTHER): Payer: Self-pay

## 2024-07-04 ENCOUNTER — Ambulatory Visit (INDEPENDENT_AMBULATORY_CARE_PROVIDER_SITE_OTHER): Admitting: Family Medicine

## 2024-07-04 ENCOUNTER — Encounter (HOSPITAL_BASED_OUTPATIENT_CLINIC_OR_DEPARTMENT_OTHER): Payer: Self-pay | Admitting: Family Medicine

## 2024-07-04 ENCOUNTER — Other Ambulatory Visit (HOSPITAL_BASED_OUTPATIENT_CLINIC_OR_DEPARTMENT_OTHER): Payer: Self-pay | Admitting: Family Medicine

## 2024-07-04 VITALS — BP 108/78 | HR 71 | Ht 62.0 in | Wt 107.0 lb

## 2024-07-04 DIAGNOSIS — L0291 Cutaneous abscess, unspecified: Secondary | ICD-10-CM

## 2024-07-04 NOTE — Progress Notes (Signed)
 Acute Care Office Visit  Subjective:   Samantha Trevino 1981/01/19 07/04/2024  Chief Complaint  Patient presents with   Wound Check    Patient is here today for a wound check. States that the abscess has begun to ooze.    HPI: Patient is here for wound check of abscess to right axillae after I&D in office by PCP on 07/02/2024 with packing. She has been pulling out the packing as directed with proper wound care. She states she has significant improvement of pain, but still has a large induration and small amount of drainage from area. Denies fever, chills. She has 3-4 days left of Bactrim  to finish course.    The following portions of the patient's history were reviewed and updated as appropriate: past medical history, past surgical history, family history, social history, allergies, medications, and problem list.   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   Recurrent infection of skin 06/21/2024   SVD (spontaneous vaginal delivery) 01/17/2021   Encounter for induction of labor 01/16/2021   Postpartum care following vaginal delivery (10/25) 08/19/2012   Past Medical History:  Diagnosis Date   Alopecia areata    History of MRSA infection    Medical history non-contributory    Varicose veins of both lower extremities    Venous insufficiency    Past Surgical History:  Procedure Laterality Date   DILATION AND CURETTAGE OF UTERUS     NO PAST SURGERIES     Family History  Problem Relation Age of Onset   Diabetes Mother    Hypertension Father    Heart disease Father    Diabetes Father    Hyperlipidemia Father    Stroke Father    Early death Maternal Grandfather    Early death Maternal Grandmother    Hearing loss Paternal  Grandfather    Varicose Veins Maternal Aunt    Outpatient Medications Prior to Visit  Medication Sig Dispense Refill   clindamycin  (CLINDAGEL ) 1 % gel Apply 1 Application topically 2 (two) times daily. 30 g 5   Ferrous Sulfate (IRON SUPPLEMENT PO) Take by mouth.     ibuprofen  (ADVIL ) 800 MG tablet Take 1 tablet (800 mg total) by mouth every 8 (eight) hours as needed. 30 tablet 0   minoxidil  (LONITEN ) 2.5 MG tablet Take 1 tablet (2.5 mg total) by mouth daily. 30 tablet 5   sulfamethoxazole -trimethoprim  (BACTRIM  DS) 800-160 MG tablet Take 1 tablet by mouth 2 (two) times daily for 7 days. 14 tablet 0   tretinoin  (RETIN-A ) 0.1 % cream Apply pea sized amount topically to face at bedtime. 45 g 3   No facility-administered medications prior to visit.   No Known Allergies   ROS: A complete ROS was performed with pertinent positives/negatives noted in the HPI. The remainder of the ROS are negative.    Objective:   Today's Vitals   07/04/24 0828  BP: 108/78  Pulse: 71  SpO2: 97%  Weight: 107 lb (48.5 kg)  Height: 5' 2 (1.575 m)    GENERAL: Well-appearing, in NAD. Well nourished.  SKIN: Pink, warm and dry. Moderate induration to abscess approx. 2cm with small clear drainage. Tender to palpation. Packing removed.  Head: Normocephalic. NECK: Trachea midline. Full ROM w/o pain  or tenderness. RESPIRATORY: Chest wall symmetrical. Respirations even and non-labored.  EXTREMITIES: Without clubbing, cyanosis, or edema.  NEUROLOGIC: No motor or sensory deficits. Steady, even gait. C2-C12 intact.  PSYCH/MENTAL STATUS: Alert, oriented x 3. Cooperative, appropriate mood and affect.    No results found for any visits on 07/04/24.    Assessment & Plan:  1. Abscess (Primary) Patient reports improvement in pain, but concern for possible cyst needing further excision of capsule given ongoing induration and drainage despite I&D and antibiotics. Will continue Bactrim  and antibiotic ointment applied to  wound with clean bandage. Recommend urgent referral to Dermatology for evaluation and possible excision. Patient agreeable. Will return to PCP if worsening or no improvement in 48 hours.  - Ambulatory referral to Dermatology   No orders of the defined types were placed in this encounter.  Lab Orders  No laboratory test(s) ordered today   No images are attached to the encounter or orders placed in the encounter.  Return if symptoms worsen or fail to improve.    Patient to reach out to office if new, worrisome, or unresolved symptoms arise or if no improvement in patient's condition. Patient verbalized understanding and is agreeable to treatment plan. All questions answered to patient's satisfaction.    Thersia Schuyler [place], [place]

## 2024-07-05 ENCOUNTER — Encounter: Admitting: Rheumatology

## 2024-07-07 ENCOUNTER — Other Ambulatory Visit (HOSPITAL_BASED_OUTPATIENT_CLINIC_OR_DEPARTMENT_OTHER): Payer: Self-pay

## 2024-07-09 ENCOUNTER — Ambulatory Visit: Attending: Rheumatology | Admitting: Rheumatology

## 2024-07-09 ENCOUNTER — Encounter: Payer: Self-pay | Admitting: Rheumatology

## 2024-07-09 VITALS — BP 99/67 | HR 81 | Temp 97.9°F | Resp 14 | Ht 62.25 in | Wt 108.6 lb

## 2024-07-09 DIAGNOSIS — R768 Other specified abnormal immunological findings in serum: Secondary | ICD-10-CM | POA: Diagnosis not present

## 2024-07-09 DIAGNOSIS — F418 Other specified anxiety disorders: Secondary | ICD-10-CM

## 2024-07-09 DIAGNOSIS — L089 Local infection of the skin and subcutaneous tissue, unspecified: Secondary | ICD-10-CM

## 2024-07-09 DIAGNOSIS — E538 Deficiency of other specified B group vitamins: Secondary | ICD-10-CM

## 2024-07-09 DIAGNOSIS — I83893 Varicose veins of bilateral lower extremities with other complications: Secondary | ICD-10-CM

## 2024-07-09 DIAGNOSIS — L659 Nonscarring hair loss, unspecified: Secondary | ICD-10-CM | POA: Diagnosis not present

## 2024-07-09 DIAGNOSIS — Z789 Other specified health status: Secondary | ICD-10-CM | POA: Diagnosis not present

## 2024-07-09 DIAGNOSIS — M249 Joint derangement, unspecified: Secondary | ICD-10-CM

## 2024-07-09 DIAGNOSIS — L739 Follicular disorder, unspecified: Secondary | ICD-10-CM

## 2024-07-09 DIAGNOSIS — E559 Vitamin D deficiency, unspecified: Secondary | ICD-10-CM | POA: Diagnosis not present

## 2024-07-09 DIAGNOSIS — H906 Mixed conductive and sensorineural hearing loss, bilateral: Secondary | ICD-10-CM

## 2024-07-09 DIAGNOSIS — L639 Alopecia areata, unspecified: Secondary | ICD-10-CM | POA: Diagnosis not present

## 2024-07-09 DIAGNOSIS — R790 Abnormal level of blood mineral: Secondary | ICD-10-CM

## 2024-07-09 DIAGNOSIS — K5909 Other constipation: Secondary | ICD-10-CM

## 2024-07-10 DIAGNOSIS — L02411 Cutaneous abscess of right axilla: Secondary | ICD-10-CM | POA: Diagnosis not present

## 2024-07-10 NOTE — Telephone Encounter (Signed)
Please see message sent by pt.

## 2024-07-14 LAB — CARDIOLIPIN ANTIBODIES, IGG, IGM, IGA
Anticardiolipin IgA: <2 [APL'U]/mL (ref <20.0)
Anticardiolipin IgG: <2 [GPL'U]/mL (ref <20.0)
Anticardiolipin IgM: <2 [MPL'U]/mL (ref <20.0)

## 2024-07-14 LAB — ANTI-DNA ANTIBODY, DOUBLE-STRANDED: ds DNA Ab: <1 [IU]/mL

## 2024-07-14 LAB — PAN-ANCA
ANCA SCREEN: NEGATIVE
Myeloperoxidase Abs: <1 AI (ref <1.0)
Serine Protease 3: <1 AI (ref <1.0)

## 2024-07-14 LAB — ANTI-SMITH ANTIBODY: ENA SM Ab Ser-aCnc: <1 AI

## 2024-07-14 LAB — BETA-2 GLYCOPROTEIN ANTIBODIES
Beta-2 Glyco 1 IgA: <2 U/mL (ref <20.0)
Beta-2 Glyco 1 IgM: <2 U/mL (ref <20.0)
Beta-2 Glyco I IgG: <2 U/mL (ref <20.0)

## 2024-07-14 LAB — ANTI-SCLERODERMA ANTIBODY: Scleroderma (Scl-70) (ENA) Antibody, IgG: <1 AI

## 2024-07-14 LAB — SJOGRENS SYNDROME-B EXTRACTABLE NUCLEAR ANTIBODY: SSB (La) (ENA) Antibody, IgG: <1 AI

## 2024-07-14 LAB — ANA: Anti Nuclear Antibody (ANA): NEGATIVE

## 2024-07-14 LAB — RNP ANTIBODY: Ribonucleic Protein(ENA) Antibody, IgG: <1 AI

## 2024-07-14 LAB — SJOGRENS SYNDROME-A EXTRACTABLE NUCLEAR ANTIBODY: SSA (Ro) (ENA) Antibody, IgG: 1.1 AI — AB

## 2024-07-14 LAB — C3 AND C4
C3 Complement: 116 mg/dL (ref 83–193)
C4 Complement: 24 mg/dL (ref 15–57)

## 2024-07-16 ENCOUNTER — Ambulatory Visit: Payer: Self-pay | Admitting: Rheumatology

## 2024-07-16 NOTE — Progress Notes (Signed)
 SSA antibody low titer positive.  All other labs are within normal limits.  I will discuss results at the follow-up visit.

## 2024-07-23 ENCOUNTER — Other Ambulatory Visit: Payer: Self-pay | Admitting: *Deleted

## 2024-07-23 DIAGNOSIS — R768 Other specified abnormal immunological findings in serum: Secondary | ICD-10-CM | POA: Diagnosis not present

## 2024-07-23 LAB — PROTEIN / CREATININE RATIO, URINE
Creatinine, Urine: 7 mg/dL — ABNORMAL LOW (ref 20–275)
Total Protein, Urine: <4 mg/dL — ABNORMAL LOW (ref 5–24)

## 2024-07-23 NOTE — Progress Notes (Signed)
 Office Visit Note  Patient: Samantha Trevino             Date of Birth: 07-17-1981           MRN: [id number]             PCP: Knute Thersia Bitters, FNP Referring: Knute Thersia Bitters, * Visit Date: 08/06/2024 Occupation: Data Unavailable  Subjective:  Positive ANA and hair loss  History of Present Illness: Samantha Trevino is a 43 y.o. female with positive ANA and hair loss.  She returns today after her initial visit on July 09, 2024.  She states she has been on minoxidil  2.5 mg daily.  She has noticed increased hair loss recently.  She is wondering if she is going through a cycle of hair loss.  She has not had any recent lesions of folliculitis.  She denies any history of oral ulcers, nasal ulcers, sicca symptoms, fatigue, malar rash, photosensitivity, lymphadenopathy.  There is no history of shortness of breath.    Activities of Daily Living:  Patient reports morning stiffness for 0 minutes.   Patient Denies nocturnal pain.  Difficulty dressing/grooming: Denies Difficulty climbing stairs: Denies Difficulty getting out of chair: Denies Difficulty using hands for taps, buttons, cutlery, and/or writing: Denies  Review of Systems  Constitutional:  Negative for fatigue.  HENT:  Negative for mouth sores and mouth dryness.   Eyes:  Negative for dryness.  Respiratory:  Negative for shortness of breath.   Cardiovascular:  Negative for chest pain and palpitations.  Gastrointestinal:  Positive for blood in stool and constipation. Negative for diarrhea.       Scheduled with GI on 08/13/2024.  Endocrine: Negative for increased urination.  Genitourinary:  Negative for involuntary urination.  Musculoskeletal:  Negative for joint pain, gait problem, joint pain, joint swelling, myalgias, muscle weakness, morning stiffness, muscle tenderness and myalgias.  Skin:  Positive for hair loss. Negative for color change, rash and sensitivity to sunlight.  Allergic/Immunologic: Negative for susceptible  to infections.  Neurological:  Negative for dizziness and headaches.  Hematological:  Negative for swollen glands.  Psychiatric/Behavioral:  Negative for depressed mood and sleep disturbance. The patient is not nervous/anxious.     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   Recurrent infection of skin 06/21/2024   SVD (spontaneous vaginal delivery) 01/17/2021   Encounter for induction of labor 01/16/2021   Postpartum care following vaginal delivery (10/25) 08/19/2012    Past Medical History:  Diagnosis Date   Alopecia areata    History of MRSA infection    Medical history non-contributory    Varicose veins of both lower extremities    Venous insufficiency     Family History  Problem Relation Age of Onset   Diabetes Mother    Thyroid  disease Mother    Other Mother        trigeminal neuralgia   Hypertension Father    Heart disease Father    Diabetes Father    Hyperlipidemia Father    Stroke Father    Healthy Sister    High Cholesterol Brother    Varicose Veins Maternal Aunt    Early death Maternal Grandmother    Early death Maternal Grandfather    Hearing loss Paternal Grandfather  Healthy Son    Healthy Daughter    Past Surgical History:  Procedure Laterality Date   DILATION AND CURETTAGE OF UTERUS     Social History   Tobacco Use   Smoking status: Never    Passive exposure: Never   Smokeless tobacco: Never  Vaping Use   Vaping status: Never Used  Substance Use Topics   Alcohol use: Yes    Comment: occ   Drug use: No   Social History   Social History Narrative   Not on file     Immunization History  Administered Date(s) Administered   DTaP 07/27/2012   Influenza Whole 08/03/2012    Influenza, Seasonal, Injecte, Preservative Fre 07/22/2023   Tdap 11/03/2020     Objective: Vital Signs: BP 109/74   Pulse 84   Temp 98.3 F (36.8 C)   Resp 14   Ht 5' 2 (1.575 m)   Wt 108 lb 9.6 oz (49.3 kg)   LMP 08/01/2024 (Exact Date)   BMI 19.86 kg/m    Physical Exam Vitals and nursing note reviewed.  Constitutional:      Appearance: She is well-developed.  HENT:     Head: Normocephalic and atraumatic.  Eyes:     Conjunctiva/sclera: Conjunctivae normal.  Cardiovascular:     Rate and Rhythm: Normal rate and regular rhythm.     Heart sounds: Normal heart sounds.  Pulmonary:     Effort: Pulmonary effort is normal.     Breath sounds: Normal breath sounds.  Abdominal:     General: Bowel sounds are normal.     Palpations: Abdomen is soft.  Musculoskeletal:     Cervical back: Normal range of motion.  Lymphadenopathy:     Cervical: No cervical adenopathy.  Skin:    General: Skin is warm and dry.     Capillary Refill: Capillary refill takes less than 2 seconds.  Neurological:     Mental Status: She is alert and oriented to person, place, and time.  Psychiatric:        Behavior: Behavior normal.      Musculoskeletal Exam:Cervical, thoracic and lumbar spine were in good range of motion.  There was no SI joint tenderness.  Shoulder joints, elbow joints, wrist joints, MCPs, PIPs and DIPs were in good range of motion with no synovitis.  Hip joints and knee joints were in good range of motion without any warmth swelling or effusion.  There was no tenderness over ankles or MTPs.  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   CDAI Exam: CDAI Score: -- Patient Global: --; Provider Global: -- Swollen: --; Tender: -- Joint Exam 08/06/2024   No joint exam has been documented for this visit   There is currently no information documented on the homunculus. Go to the Rheumatology activity and complete the homunculus joint exam.  Investigation: No additional  findings.  Imaging: No results found.  Recent Labs: Lab Results  Component Value Date   WBC 5.4 06/22/2024   HGB 13.4 06/22/2024   PLT 234 06/22/2024   NA 136 06/22/2024   K 4.2 06/22/2024   CL 103 06/22/2024   CO2 18 (L) 06/22/2024   GLUCOSE 84 06/22/2024   BUN 13 06/22/2024   CREATININE 0.61 06/22/2024   BILITOT 0.5 06/22/2024   ALKPHOS 50 06/22/2024   AST 15 06/22/2024   ALT 14 06/22/2024   PROT 6.9 06/22/2024   ALBUMIN 4.1 06/22/2024   CALCIUM 9.1 06/22/2024   July 09, 2024 ANA negative, SSA  1.1, SSB negative, SCL 70 negative, RNP negative, Smith negative, dsDNA negative, C3-C4 normal, anticardiolipin negative, beta-2  GP 1 negative, MPO negative, serum protease 3 negative   Speciality Comments: No specialty comments available.  Procedures:  No procedures performed Allergies: Patient has no known allergies.   Assessment / Plan:     Visit Diagnoses: Positive ANA (antinuclear antibody) - July 09, 2024 ANA negative, SSA 1.1, SSB negative, SCL 70 negative, RNP negative, Smith negative, dsDNA negative, C3-C4 normal, anticardiolipin negative, beta-2  GP 1 negative, MPO negative, serum protease 3 negative-lab results were reviewed with the patient at length.  She has low titer SSA positive antibody.  She denies history of dry mouth or dry eyes.  She states her eyes get dry after staring at the computer for some time.  She denies any history of dry skin.  There is no history of palpitations or shortness of breath.  There is no history of lymphadenopathy.  A recheck SSA in the body in 6 months.  Association of SSA antibody with Sjogren's, arrhythmias, lymphadenopathy, lymphoma, parotid swelling, interstitial lung disease was discussed.  I advised her to get a baseline EKG with her PCP.  Plan: Anti-DNA antibody, double-stranded, C3 and C4, ANA, Sedimentation rate, Sjogrens syndrome-A extractable nuclear antibody  Hair loss - Hair loss for the last 2 months.  She is on  minoxidil  2.5 mg daily.  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  She has been continuing to have hair loss.  She may benefit from dutasteride.  She has an appointment coming up with the PCP.  Use of ketoconazole shampoo was also discussed.  Regular exercise and relaxation was discussed.  Alopecia areata - 2020 and 2022 which responded to intralesional steroid injections.  Folliculitis - Intranasal, axillary, vaginal and anal region.  Concerned about hidradenitis suppurativa.  Appointment with dermatology pending.  Use of topical clindamycin  gel and antibacterial shampoo was discussed.  Recurrent infection of skin  B12 deficiency - She is vegetarian.  She is considering B12 injections which she will discuss with her PCP.  Vitamin D  deficiency - Vitamin D  normal in August 2025.  Low ferritin-she has been experiencing some blood in her stool and has an appointment coming up with GYN.  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  She has no symptoms currently.  Varicose veins of bilateral lower extremities with other complications  Vegetarian diet  Chronic constipation  Situational anxiety  Orders: Orders Placed This Encounter  Procedures   Anti-DNA antibody, double-stranded   C3 and C4   ANA   Sedimentation rate   Sjogrens syndrome-A extractable nuclear antibody   No orders of the defined types were placed in this encounter.    Follow-Up Instructions: No follow-ups on file.   Maya Nash, MD  Note - This record has been created using Animal nutritionist.  Chart creation errors have been sought, but may not always  have been located. Such creation errors do not reflect on  the standard of medical care.

## 2024-07-24 NOTE — Progress Notes (Signed)
 Protein creatinine ratio normal.

## 2024-07-26 ENCOUNTER — Other Ambulatory Visit (HOSPITAL_BASED_OUTPATIENT_CLINIC_OR_DEPARTMENT_OTHER): Payer: Self-pay

## 2024-07-31 ENCOUNTER — Ambulatory Visit: Admitting: Audiologist

## 2024-08-03 NOTE — Progress Notes (Signed)
 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      06/22/2024 CBC normal and CMP normal.  Seen by OB/GYN.  Referred to us  for melena with some weight loss.    07/09/2024 workup for autoimmune disease was negative other than SSA antibody low titer positive.    08/06/2024 patient seen by rheumatology.  At that time discussed a low ferritin around 20.    Today, the patient tells me that she has had some abscesses on her body over the past year or so which are very painful.  This started with 3 anal abscesses in different locations treated with antibiotics, then 1 was in her nose and then her armpit, the last 2 months ago.  They worked her up for an autoimmune cause as she also had hair loss and inability to gain weight/weight loss.  Her rheumatology workup was negative.  They told her to follow-up with us  for further workup of possible IBD.         Describes that she has always run constipated describing a bowel movement every 2 to 3 days and she when she wipes every time she sees bright red blood over the past couple of years.  Describes a grandfather with possible IBD but he passed away before they could figure out what was going on.  Tells me that at some point she was having better bowel movements but it got worse over the past year or so.  Describes that over the past 2 years she has lost weight, a total of around 10 pounds and she is now stuck at 105-108 pounds with inability to gain weight regardless of what she eats.  Also tells me that her ferritin was low at 20 from her OB/GYN, we do not have these labs.  Tells me the iron tablet is hard for her to take though she tries due to constipation.    Denies fever, chills, nausea, vomiting, heartburn, reflux or abdominal pain.  Past Medical History:   Diagnosis Date   Alopecia areata    History of MRSA infection    Medical history non-contributory    Varicose veins of both lower extremities    Venous insufficiency     Past Surgical History:  Procedure Laterality Date   DILATION AND CURETTAGE OF UTERUS      Current Outpatient Medications  Medication Sig Dispense Refill   clindamycin  (CLINDAGEL ) 1 % gel Apply 1 Application topically 2 (two) times daily. (Patient not taking: Reported on 07/09/2024) 30 g 5   Ferrous Sulfate (IRON SUPPLEMENT PO) Take by mouth.     ibuprofen  (ADVIL ) 800 MG tablet Take 1 tablet (800 mg total) by mouth every 8 (eight) hours as needed. 30 tablet 0   MAGNESIUM PO Take by mouth at bedtime.     minoxidil  (LONITEN ) 2.5 MG tablet Take 1 tablet (2.5 mg total) by mouth daily. 30 tablet 5   Omega-3 Fatty Acids (FISH OIL PO) Take by mouth daily.     OVER THE COUNTER MEDICATION Nutrafol     tretinoin  (RETIN-A ) 0.1 % cream Apply pea sized amount topically to face at bedtime. 45 g 3   No current facility-administered medications for this visit.    Allergies as of 08/13/2024   (No Known Allergies)    Family History  Problem Relation Age of Onset   Diabetes Mother    Thyroid  disease Mother    Other Mother        trigeminal neuralgia   Hypertension Father    Heart disease Father    Diabetes Father    Hyperlipidemia Father    Stroke Father    Healthy Sister    High Cholesterol Brother    Varicose Veins Maternal Aunt    Early death Maternal Grandmother    Early death Maternal Grandfather    Hearing loss Paternal Grandfather    Healthy Son    Healthy Daughter     Social History   Socioeconomic History   Marital status: Married    Spouse name: Not on file   Number of children: Not on file   Years of education: Not on file   Highest education level: Doctorate  Occupational History   Not on file  Tobacco Use   Smoking status: Never    Passive exposure: Never   Smokeless tobacco: Never  Vaping  Use   Vaping status: Never Used  Substance and Sexual Activity   Alcohol use: Yes    Comment: occ   Drug use: No   Sexual activity: Yes    Birth control/protection: None  Other Topics Concern   Not on file  Social History Narrative   Not on file   Social Drivers of Health   Financial Resource Strain: Low Risk  (06/20/2024)   Overall Financial Resource Strain (CARDIA)    Difficulty of Paying Living Expenses: Not hard at all  Food Insecurity: No Food Insecurity (06/20/2024)   Hunger Vital Sign    Worried About Running Out of Food in the Last Year: Never true    Ran Out of Food in the Last Year: Never true  Transportation Needs: No Transportation Needs (06/20/2024)   PRAPARE - Administrator, Civil Service (Medical): No    Lack of Transportation (Non-Medical): No  Physical Activity: Insufficiently Active (06/20/2024)   Exercise Vital Sign    Days of Exercise per Week: 4 days    Minutes of Exercise per Session: 30 min  Stress: No Stress Concern Present (06/20/2024)   [employer] of Occupational Health - Occupational Stress Questionnaire    Feeling of Stress: Not at all  Social Connections: Socially Integrated (06/20/2024)   Social Connection and Isolation Panel    Frequency of Communication with Friends and Family: More than three times a week    Frequency of Social Gatherings with Friends and Family: More than three times a week    Attends Religious Services: More than 4 times per year    Active Member of [employer] or Organizations: Yes    Attends Engineer, structural: More than 4 times per year    Marital Status: Married  Catering manager Violence: Not on file    Review of Systems:    Constitutional: No fever or chills Skin: No rash Cardiovascular: No chest pain Respiratory: No SOB  Gastrointestinal: See HPI and otherwise negative Genitourinary: No dysuria Neurological: No headache, dizziness or syncope Musculoskeletal: No new muscle or joint  pain Hematologic: No bruising Psychiatric: No history of depression or anxiety   Physical Exam:  Vital signs: BP 96/60 (BP Location: Left Arm, Patient Position: Sitting, Cuff Size: Normal)   Pulse 83   Ht 5' 2 (1.575 m)   Wt 108 lb 2 oz (49 kg)   LMP 08/01/2024 (Exact Date)   BMI 19.78 kg/m  Constitutional:   Pleasant female appears to be in NAD, Well developed, Well nourished, alert and cooperative Head:  Normocephalic and atraumatic. Eyes:   PEERL, EOMI. No icterus. Conjunctiva pink. Ears:  Normal auditory acuity. Neck:  Supple Throat: Oral cavity and pharynx without inflammation, swelling or lesion.  Respiratory: Respirations even and unlabored. Lungs clear to auscultation bilaterally.   No wheezes, crackles, or rhonchi.  Cardiovascular: Normal S1, S2. No MRG. Regular rate and rhythm. No peripheral edema, cyanosis or pallor.  Gastrointestinal:  Soft, nondistended, nontender. No rebound or guarding. Normal bowel sounds. No appreciable masses or hepatomegaly. Rectal:  Declined Msk:  Symmetrical without gross deformities. Without edema, no deformity or joint abnormality.  Neurologic:  Alert and  oriented x4;  grossly normal neurologically.  Skin:   Dry and intact without significant lesions or rashes. Psychiatric: Demonstrates good judgement and reason without abnormal affect or behaviors.  RELEVANT LABS AND IMAGING and see HPI: CBC    Component Value Date/Time   WBC 5.4 06/22/2024 1158   WBC 11.2 (H) 01/17/2021 0452   RBC 4.37 06/22/2024 1158   RBC 4.08 01/17/2021 0452   HGB 13.4 06/22/2024 1158   HCT 40.6 06/22/2024 1158   PLT 234 06/22/2024 1158   MCV 93 06/22/2024 1158   MCH 30.7 06/22/2024 1158   MCH 30.4 01/17/2021 0452   MCHC 33.0 06/22/2024 1158   MCHC 33.8 01/17/2021 0452   RDW 12.7 06/22/2024 1158   LYMPHSABS 1.9 06/22/2024 1158   EOSABS 0.4 06/22/2024 1158   BASOSABS 0.0 06/22/2024 1158    CMP     Component Value Date/Time   NA 136 06/22/2024 1159    K 4.2 06/22/2024 1159   CL 103 06/22/2024 1159   CO2 18 (L) 06/22/2024 1159   GLUCOSE 84 06/22/2024 1159   BUN 13 06/22/2024 1159   CREATININE 0.61 06/22/2024 1159   CALCIUM 9.1 06/22/2024 1159   PROT 6.9 06/22/2024 1159   ALBUMIN 4.1 06/22/2024 1159   AST 15 06/22/2024 1159   ALT 14 06/22/2024 1159   ALKPHOS 50 06/22/2024 1159   BILITOT 0.5 06/22/2024 1159    Assessment: 1.  Change in bowel habits: Describes a change towards constipation with a bowel once every 2 to 3 days, also takes laxatives on the weekends sometimes to help over the past year, also with hematochezia with every bowel movement; consider IBD versus IBS 2.  Hematochezia: CBC and 06/22/2024 with normal hemoglobin, describes a low ferritin around 20 over the past month (we do not have these labs from gynecology), history of anal abscess x 3, the last 6 months ago; consider IBD versus hemorrhoids versus fissure versus ongoing abscess 3.  Weight loss: Weight loss of about 10 pounds over the past 2 years and and inability to gain weight; consider celiac disease versus malabsorption versus IBD 4.  IDA: Patient reports a ferritin of 20 (requesting labs today) 5.  History of anal abscess: 3 times in separate areas treated with antibiotics, last 6 months ago, rheumatology workup negative; consider IBD  Plan: 1.  Patient for diagnostic EGD and colonoscopy in the [facility] with Dr. Nandigam.  Did provide the patient with a detailed list of risks for procedures and she agrees to proceed. Patient is appropriate for endoscopic procedure(s) in the ambulatory ([facility]) setting.  2.  Will request labs from gynecology 3.  Encouraged patient to use her oral iron supplement every other day with vitamin C. 4.  Encouraged patient to use MiraLAX on a more regular  basis for constipation 5.  Patient will have a 2-day bowel prep. 6.  Patient to follow in clinic per recommendations after time of procedure.  Delon Failing, PA-C [facility] 08/03/2024, 11:02 AM  Cc: Nanci Senior, MD

## 2024-08-06 ENCOUNTER — Encounter: Payer: Self-pay | Admitting: Rheumatology

## 2024-08-06 ENCOUNTER — Ambulatory Visit: Attending: Rheumatology | Admitting: Rheumatology

## 2024-08-06 VITALS — BP 109/74 | HR 84 | Temp 98.3°F | Resp 14 | Ht 62.0 in | Wt 108.6 lb

## 2024-08-06 DIAGNOSIS — L739 Follicular disorder, unspecified: Secondary | ICD-10-CM

## 2024-08-06 DIAGNOSIS — R768 Other specified abnormal immunological findings in serum: Secondary | ICD-10-CM

## 2024-08-06 DIAGNOSIS — E538 Deficiency of other specified B group vitamins: Secondary | ICD-10-CM

## 2024-08-06 DIAGNOSIS — R7689 Other specified abnormal immunological findings in serum: Secondary | ICD-10-CM | POA: Diagnosis not present

## 2024-08-06 DIAGNOSIS — L089 Local infection of the skin and subcutaneous tissue, unspecified: Secondary | ICD-10-CM | POA: Diagnosis not present

## 2024-08-06 DIAGNOSIS — E559 Vitamin D deficiency, unspecified: Secondary | ICD-10-CM | POA: Diagnosis not present

## 2024-08-06 DIAGNOSIS — M249 Joint derangement, unspecified: Secondary | ICD-10-CM

## 2024-08-06 DIAGNOSIS — I83893 Varicose veins of bilateral lower extremities with other complications: Secondary | ICD-10-CM

## 2024-08-06 DIAGNOSIS — L639 Alopecia areata, unspecified: Secondary | ICD-10-CM | POA: Diagnosis not present

## 2024-08-06 DIAGNOSIS — Z789 Other specified health status: Secondary | ICD-10-CM

## 2024-08-06 DIAGNOSIS — R790 Abnormal level of blood mineral: Secondary | ICD-10-CM

## 2024-08-06 DIAGNOSIS — L659 Nonscarring hair loss, unspecified: Secondary | ICD-10-CM | POA: Diagnosis not present

## 2024-08-06 DIAGNOSIS — K5909 Other constipation: Secondary | ICD-10-CM

## 2024-08-06 DIAGNOSIS — F418 Other specified anxiety disorders: Secondary | ICD-10-CM

## 2024-08-10 ENCOUNTER — Other Ambulatory Visit (HOSPITAL_BASED_OUTPATIENT_CLINIC_OR_DEPARTMENT_OTHER): Payer: Self-pay

## 2024-08-10 ENCOUNTER — Encounter (HOSPITAL_BASED_OUTPATIENT_CLINIC_OR_DEPARTMENT_OTHER): Payer: Self-pay | Admitting: Family Medicine

## 2024-08-10 DIAGNOSIS — E538 Deficiency of other specified B group vitamins: Secondary | ICD-10-CM

## 2024-08-10 DIAGNOSIS — L639 Alopecia areata, unspecified: Secondary | ICD-10-CM

## 2024-08-10 MED ORDER — KETOCONAZOLE 2 % EX SHAM
MEDICATED_SHAMPOO | CUTANEOUS | 5 refills | Status: AC
  Filled 2024-08-10: qty 120, 30d supply, fill #0
  Filled 2024-10-24: qty 120, 30d supply, fill #1

## 2024-08-10 MED ORDER — CYANOCOBALAMIN 1000 MCG/ML IJ SOLN
INTRAMUSCULAR | 1 refills | Status: AC
  Filled 2024-08-10: qty 6, 90d supply, fill #0
  Filled 2024-10-24: qty 6, 90d supply, fill #1

## 2024-08-10 NOTE — Telephone Encounter (Signed)
 Please see mychart message sent by pt and advise.

## 2024-08-13 ENCOUNTER — Encounter: Payer: Self-pay | Admitting: Physician Assistant

## 2024-08-13 ENCOUNTER — Ambulatory Visit: Admitting: Physician Assistant

## 2024-08-13 ENCOUNTER — Other Ambulatory Visit (HOSPITAL_BASED_OUTPATIENT_CLINIC_OR_DEPARTMENT_OTHER): Payer: Self-pay

## 2024-08-13 VITALS — BP 96/60 | HR 83 | Ht 62.0 in | Wt 108.1 lb

## 2024-08-13 DIAGNOSIS — K921 Melena: Secondary | ICD-10-CM | POA: Diagnosis not present

## 2024-08-13 DIAGNOSIS — R634 Abnormal weight loss: Secondary | ICD-10-CM

## 2024-08-13 DIAGNOSIS — D509 Iron deficiency anemia, unspecified: Secondary | ICD-10-CM

## 2024-08-13 DIAGNOSIS — K5900 Constipation, unspecified: Secondary | ICD-10-CM

## 2024-08-13 DIAGNOSIS — R194 Change in bowel habit: Secondary | ICD-10-CM

## 2024-08-13 MED ORDER — NA SULFATE-K SULFATE-MG SULF 17.5-3.13-1.6 GM/177ML PO SOLN
1.0000 | ORAL | 0 refills | Status: AC
  Filled 2024-08-13: qty 354, 1d supply, fill #0
  Filled 2024-09-10: qty 354, 2d supply, fill #0

## 2024-08-13 NOTE — Patient Instructions (Signed)
 We have sent the following medications to your pharmacy for you to pick up at your convenience: Suprep    CPT codes for Colonoscopy 54621, EGD 43235. You may contact your insurance provider to ask about cost.   ________________________________________________  If your blood pressure at your visit was 140/90 or greater, please contact your primary care physician to follow up on this.  _______________________________________________________  If you are age 41 or older, your body mass index should be between 23-30. Your Body mass index is 19.78 kg/m. If this is out of the aforementioned range listed, please consider follow up with your Primary Care Provider.  If you are age 41 or younger, your body mass index should be between 19-25. Your Body mass index is 19.78 kg/m. If this is out of the aformentioned range listed, please consider follow up with your Primary Care Provider.   ________________________________________________________  The [facility] providers would like to encourage you to use MYCHART to communicate with providers for non-urgent requests or questions.  Due to long hold times on the telephone, sending your provider a message by [facility] may be a faster and more efficient way to get a response.  Please allow 48 business hours for a response.  Please remember that this is for non-urgent requests.  _______________________________________________________  [facility] is using a team-based approach to care.  Your team is made up of your doctor and two to three APPS. Our APPS (Nurse Practitioners and Physician Assistants) work with your physician to ensure care continuity for you. They are fully qualified to address your health concerns and develop a treatment plan. They communicate directly with your gastroenterologist to care for you. Seeing the Advanced Practice Practitioners on your physician's team can help you by facilitating care more promptly, often allowing for  earlier appointments, access to diagnostic testing, procedures, and other specialty referrals.   Due to recent changes in healthcare laws, you may see the results of your imaging and laboratory studies on MyChart before your provider has had a chance to review them.  We understand that in some cases there may be results that are confusing or concerning to you. Not all laboratory results come back in the same time frame and the provider may be waiting for multiple results in order to interpret others.  Please give us  48 hours in order for your provider to thoroughly review all the results before contacting the office for clarification of your results.   Thank you for choosing me and [facility].  Delon Failing, PA-C

## 2024-08-15 ENCOUNTER — Other Ambulatory Visit (HOSPITAL_BASED_OUTPATIENT_CLINIC_OR_DEPARTMENT_OTHER): Payer: Self-pay

## 2024-08-15 MED ORDER — FLUZONE 0.5 ML IM SUSY
0.5000 mL | PREFILLED_SYRINGE | Freq: Once | INTRAMUSCULAR | 0 refills | Status: AC
  Filled 2024-08-15: qty 0.5, 1d supply, fill #0

## 2024-08-15 NOTE — Telephone Encounter (Signed)
 Spoke with pt about message from PCP. Pt is aware that she needs to schedule appt to have testing performed.

## 2024-08-16 ENCOUNTER — Ambulatory Visit (HOSPITAL_BASED_OUTPATIENT_CLINIC_OR_DEPARTMENT_OTHER): Admitting: Family Medicine

## 2024-08-16 ENCOUNTER — Ambulatory Visit (INDEPENDENT_AMBULATORY_CARE_PROVIDER_SITE_OTHER): Admitting: Family Medicine

## 2024-08-16 ENCOUNTER — Encounter (HOSPITAL_BASED_OUTPATIENT_CLINIC_OR_DEPARTMENT_OTHER): Payer: Self-pay | Admitting: Family Medicine

## 2024-08-16 VITALS — BP 105/71 | HR 95 | Temp 98.7°F | Resp 16 | Ht 63.0 in | Wt 107.8 lb

## 2024-08-16 DIAGNOSIS — E538 Deficiency of other specified B group vitamins: Secondary | ICD-10-CM | POA: Diagnosis not present

## 2024-08-16 DIAGNOSIS — D8989 Other specified disorders involving the immune mechanism, not elsewhere classified: Secondary | ICD-10-CM | POA: Insufficient documentation

## 2024-08-16 NOTE — Progress Notes (Signed)
 Subjective:   Samantha Trevino June 07, 1981 08/16/2024  Chief Complaint  Patient presents with   EKG testing    Pt. Here for a EKG testing. Pt. Denies pain at this time.     HPI: Samantha Trevino presents today for EKG testing per Dr. 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 Patient had a Sjogren syndrome a extractable nuclear antibody test with rheumatology.  Patient requesting EKG results sent to Dr. Bonney.  She is currently asymptomatic for any type of arrhythmia including palpitations, chest pain, shortness of breath or dyspnea with exertion. She is currently starting ketoconazole shampoo (rx provided by PCP) for hair loss and thinning per rheumatology recommendations.   VITAMIN B12 DEFICIENCY: Samantha Trevino presents for the medical management of B12 deficiency.  Current medication regimen: Cyanocobalamin 1000mcg once monthly  Compliant with regimen: Yes  Denies fatigue, tingling in extremities.  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 Lab Results  Component Value Date   VITAMINB12 345 06/22/2024      The following portions of the patient's history were reviewed and updated as appropriate: past medical history, past surgical history, family history, social history, allergies, medications, and problem list.   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   Recurrent infection of skin 06/21/2024   SVD (spontaneous vaginal delivery) 01/17/2021   Encounter for induction of labor 01/16/2021   Postpartum care following vaginal delivery (10/25) 08/19/2012   Past Medical History:  Diagnosis Date   Alopecia areata    History of MRSA  infection    Medical history non-contributory    Varicose veins of both lower extremities    Venous insufficiency    Past Surgical History:  Procedure Laterality Date   DILATION AND CURETTAGE OF UTERUS     Family History  Problem Relation Age of Onset   Diabetes Mother    Thyroid  disease Mother    Other Mother        trigeminal neuralgia   Hypertension Father    Heart disease Father    Diabetes Father    Hyperlipidemia Father    Stroke Father    Healthy Sister    High Cholesterol Brother    Varicose Veins Maternal Aunt    Early death Maternal Grandmother    Early death Maternal Grandfather    Hearing loss Paternal Grandfather    Healthy Son    Healthy Daughter    Outpatient Medications Prior to Visit  Medication Sig Dispense Refill   clindamycin  (CLINDAGEL ) 1 % gel Apply 1 Application topically 2 (two) times daily. 30 g 5   COLOSTRUM PO Take by mouth daily.     cyanocobalamin (VITAMIN B12) 1000 MCG/ML injection Inject 1 mL (1,000 mcg total) intramuscular once a week for 4 weeks, THEN inject 1 mL (1,000 mcg total) intramuscularly once a month. 10 mL 1   Ferrous Sulfate (IRON SUPPLEMENT PO) Take by mouth.     ibuprofen  (ADVIL ) 800 MG tablet Take 1 tablet (800 mg total) by mouth every 8 (eight) hours as needed. 30 tablet 0   influenza vac split trivalent PF (FLUZONE) 0.5 ML injection Inject 0.5 mLs into the muscle once for 1 dose. 0.5 mL 0  ketoconazole (NIZORAL) 2 % shampoo Apply to scalp twice a week for 8 weeks and then weekly thereafter. Leave on scalp for approximately 3-5 minutes before rinsing. 120 mL 5   MAGNESIUM PO Take by mouth at bedtime.     minoxidil  (LONITEN ) 2.5 MG tablet Take 1 tablet (2.5 mg total) by mouth daily. 30 tablet 5   Na Sulfate-K Sulfate-Mg Sulfate concentrate (SUPREP BOWEL PREP KIT) 17.5-3.13-1.6 GM/177ML SOLN Take 1 kit (354 mLs total) by mouth as directed. For colonoscopy prep 354 mL 0   Omega-3 Fatty Acids (FISH OIL PO) Take by mouth daily.      OVER THE COUNTER MEDICATION Nutrafol     tretinoin  (RETIN-A ) 0.1 % cream Apply pea sized amount topically to face at bedtime. 45 g 3   No facility-administered medications prior to visit.   No Known Allergies   ROS: A complete ROS was performed with pertinent positives/negatives noted in the HPI. The remainder of the ROS are negative.    Objective:   Today's Vitals   08/16/24 1341  BP: 105/71  Pulse: 95  Resp: 16  Temp: 98.7 F (37.1 C)  TempSrc: Oral  SpO2: 100%  Weight: 107 lb 12.8 oz (48.9 kg)  Height: 5' 3 (1.6 m)  PainSc: 0-No pain    Physical Exam   GENERAL: Well-appearing, in NAD. Well nourished.  SKIN: Pink, warm and dry.  Head: Normocephalic. NECK: Trachea midline. Full ROM w/o pain or tenderness.  RESPIRATORY: Chest wall symmetrical. Respirations even and non-labored.  MSK: Muscle tone and strength appropriate for age.  NEUROLOGIC: No motor or sensory deficits. Steady, even gait. C2-C12 intact.  PSYCH/MENTAL STATUS: Alert, oriented x 3. Cooperative, appropriate mood and affect.   EKG: 08/16/2024  Vent. rate 84 BPM PR interval 126 ms QRS duration 80 ms QT/QTcB 356/420 ms P-R-T axes 80 86 38  Normal sinus rhythm with sinus arrhythmia Nonspecific T wave abnormality Abnormal ECG No previous ECGs available  Health Maintenance Due  Topic Date Due   Hepatitis C Screening  Never done   Hepatitis B Vaccines 19-59 Average Risk (1 of 3 - 19+ 3-dose series) Never done   HPV VACCINES (1 - 3-dose SCDM series) Never done    No results found for any visits on 08/16/24.  The 10-year ASCVD risk score (Arnett DK, et al., 2019) is: 0.3%     Assessment & Plan:  1. Autoimmune disorder (Primary) Patient will have repeat labs scheduled in April per rheumatology.  We discussed disease symptoms, progression, and possible treatments during visit today.  EKG was obtained and shows normal sinus rhythm with sinus arrhythmia.  As patient is asymptomatic, no further  evaluation or treatment is needed at this time.  EKG result will be faxed to Dr. Jammie office for review.  2. B12 deficiency Patient will continue with monthly B12 injections and recommend repeat of B12 with appointment in April with lab work.   No follow-ups on file.    Patient to reach out to office if new, worrisome, or unresolved symptoms arise or if no improvement in patient's condition. Patient verbalized understanding and is agreeable to treatment plan. All questions answered to patient's satisfaction.    Thersia Schuyler Stark, [place]

## 2024-08-20 ENCOUNTER — Telehealth: Payer: Self-pay

## 2024-08-20 NOTE — Telephone Encounter (Signed)
 EKG report received from: PCP  Performed on: 08/16/2024  Reviewed by: Dr. Dolphus   Results: normal sinus rhythm with sinus arrhythmia. Nonspecific T wave abnormality. Abnormal ECG.   Patient has positive SSA and positive ANA.  Patient's PCP advised her of results in the office on 08/16/2024.

## 2024-08-25 ENCOUNTER — Other Ambulatory Visit (HOSPITAL_BASED_OUTPATIENT_CLINIC_OR_DEPARTMENT_OTHER): Payer: Self-pay

## 2024-09-10 ENCOUNTER — Other Ambulatory Visit (HOSPITAL_BASED_OUTPATIENT_CLINIC_OR_DEPARTMENT_OTHER): Payer: Self-pay

## 2024-09-12 ENCOUNTER — Ambulatory Visit

## 2024-09-12 ENCOUNTER — Ambulatory Visit: Admitting: Student in an Organized Health Care Education/Training Program

## 2024-09-12 ENCOUNTER — Encounter: Payer: Self-pay | Admitting: Student in an Organized Health Care Education/Training Program

## 2024-09-12 ENCOUNTER — Ambulatory Visit: Payer: Self-pay

## 2024-09-12 VITALS — BP 105/66 | HR 89 | Temp 98.1°F | Wt 107.0 lb

## 2024-09-12 DIAGNOSIS — J029 Acute pharyngitis, unspecified: Secondary | ICD-10-CM | POA: Insufficient documentation

## 2024-09-12 LAB — POCT RAPID STREP A (OFFICE): Rapid Strep A Screen: NEGATIVE

## 2024-09-12 NOTE — Progress Notes (Signed)
 Acute Office Visit  Patient ID: Samantha Trevino, female    DOB: May 06, 1981, 43 y.o.   MRN: [id number]  PCP: Knute Thersia Bitters, FNP  Chief Complaint  Patient presents with   Sore Throat    Woke up Monday monring with sore throat and fever. As long as paitnet take tylenol  she is okay but the moment it wears off she does not fell well again, Tested negative for covid.   Tylenol  this morning at about 7am    Subjective:     HPI  Discussed the use of AI scribe software for clinical note transcription with the patient, who gave verbal consent to proceed.  History of Present Illness Samantha Trevino is a 43 year old female who presents with a severe sore throat and fever.  She woke up on Monday with a severe sore throat, describing it as the worst she has ever experienced. The sore throat has progressively worsened, and she developed a fever on Tuesday night. She has been using Tylenol  for pain management, which has provided some relief.  She performed a COVID test, which was negative, and she has received a flu shot. She reports no sinus congestion and only sporadic coughing. She mentions pain in her right ear and throat, which was alleviated with Tylenol .  She reports having had three abscesses in the past, located in the underarm, nose, and Bartholin gland. She has undergone an autoimmune panel due to these abscesses, which was mostly negative except for one positive result considered a fluke. She is not on any immunosuppressive therapy and her last check-up was good.  She works as a teacher, early years/pre at [employer] and has two children, aged three and twelve, who are currently healthy.     Objective:    BP 105/66   Pulse 89   Temp 98.1 F (36.7 C) (Oral)   Wt 107 lb (48.5 kg)   SpO2 100%   BMI 18.95 kg/m   Physical Exam  Gen: Well-appearing woman Ears: Bilateral tympanic membranes are slightly erythematous but middle ear is aerated, no effusion, left tympanic membrane looks slightly  atelectatic Mouth: Posterior oropharynx is erythematous but there is no swelling at the tonsils, no exudates Neck: No tender adenopathy Heart: Regular, no murmur Lungs: Unlabored, clear throughout    Results for orders placed or performed in visit on 09/12/24  POCT rapid strep A  Result Value Ref Range   Rapid Strep A Screen Negative Negative       Assessment & Plan:   Problem List Items Addressed This Visit       Unprioritized   Pharyngitis - Primary   Symptoms are most consistent with a viral cause of the pharyngitis/upper respiratory tract infection.  No signs of otitis media, sinus infection, or pneumonia.  She has a negative point-of-care COVID test from home.  She had 2 out of 4 Centor criteria with fever and absence of cough.  We did a point-of-care group A strep test which was negative.  No signs of peritonsillar abscess on exam, no voice changes.  I recommended supportive care, I think no value in antibiotics at this point.  We talked about anti-inflammatories for discomfort and topical throat lozenges.  I think this illness is likely to improve with time in the coming days.      Relevant Orders   POCT rapid strep A (Completed)    Return if symptoms worsen or fail to improve.  Cleatus Debby Specking, MD [facility]

## 2024-09-12 NOTE — Assessment & Plan Note (Addendum)
 Symptoms are most consistent with a viral cause of the pharyngitis/upper respiratory tract infection.  No signs of otitis media, sinus infection, or pneumonia.  She has a negative point-of-care COVID test from home.  She had 2 out of 4 Centor criteria with fever and absence of cough.  We did a point-of-care group A strep test which was negative.  No signs of peritonsillar abscess on exam, no voice changes.  I recommended supportive care, I think no value in antibiotics at this point.  We talked about anti-inflammatories for discomfort and topical throat lozenges.  I think this illness is likely to improve with time in the coming days.

## 2024-09-12 NOTE — Patient Instructions (Signed)
  VISIT SUMMARY: Today, you were seen for a severe sore throat and fever that started on Monday. You tested negative for COVID-19 and have received a flu shot. You reported no sinus congestion and only occasional coughing. You also mentioned pain in your right ear and throat, which was somewhat relieved by Tylenol . Your medical history includes previous abscesses and a mostly negative autoimmune panel.  YOUR PLAN: -ACUTE VIRAL PHARYNGITIS: Acute viral pharyngitis is an inflammation of the throat caused by a virus. Your COVID test was negative, and your throat examination showed redness without any abscess or pus, indicating a viral infection. Continue taking acetaminophen  (Tylenol ) for fever and pain relief. Rest and use throat lozenges to help with the discomfort. You should start feeling better in 2-3 days.  INSTRUCTIONS: Please follow up if your symptoms do not improve in 2-3 days or if they worsen. Continue to rest and stay hydrated.

## 2024-09-12 NOTE — Telephone Encounter (Signed)
 FYI Only or Action Required?: FYI only for provider: appointment scheduled on 09/12/2024.  Patient was last seen in primary care on 08/16/2024 by Knute Thersia Bitters, FNP.  Called Nurse Triage reporting Sore Throat.  Symptoms began yesterday.  Interventions attempted: Nothing.  Symptoms are: gradually worsening.  Triage Disposition: See Physician Within 24 Hours  Patient/caregiver understands and will follow disposition?: Yes   Copied from CRM #[id number]. Topic: Clinical - Red Word Triage >> Sep 12, 2024  9:03 AM Zy'onna H wrote: **Patient stated her throat has been hurting/in pain since yesterday** Reason for Disposition  SEVERE throat pain (e.g., excruciating)  Answer Assessment - Initial Assessment Questions 1. ONSET: When did the throat start hurting? (Hours or days ago)      yesterday 2. SEVERITY: How bad is the sore throat? (Scale 1-10; mild, moderate or severe)     7/10 3. STREP EXPOSURE: Has there been any exposure to strep within the past week? If Yes, ask: What type of contact occurred?      na 4.  VIRAL SYMPTOMS: Are there any symptoms of a cold, such as a runny nose, cough, hoarse voice or red eyes?      no 5. FEVER: Do you have a fever? If Yes, ask: What is your temperature, how was it measured, and when did it start? 101 6. PUS ON THE TONSILS: Is there pus on the tonsils in the back of your throat?     no 7. OTHER SYMPTOMS: Do you have any other symptoms? (e.g., difficulty breathing, headache, rash)     red 8. PREGNANCY: Is there any chance you are pregnant? When was your last menstrual period?     na  COVID test yesterday and results negative  Protocols used: Sore Throat-A-AH

## 2024-09-14 ENCOUNTER — Encounter: Payer: Self-pay | Admitting: Gastroenterology

## 2024-09-14 ENCOUNTER — Other Ambulatory Visit (HOSPITAL_BASED_OUTPATIENT_CLINIC_OR_DEPARTMENT_OTHER): Payer: Self-pay

## 2024-09-14 ENCOUNTER — Ambulatory Visit (AMBULATORY_SURGERY_CENTER): Admitting: Gastroenterology

## 2024-09-14 VITALS — BP 113/72 | HR 81 | Temp 98.4°F | Resp 12 | Ht 62.0 in | Wt 108.0 lb

## 2024-09-14 DIAGNOSIS — K648 Other hemorrhoids: Secondary | ICD-10-CM | POA: Diagnosis not present

## 2024-09-14 DIAGNOSIS — K644 Residual hemorrhoidal skin tags: Secondary | ICD-10-CM

## 2024-09-14 DIAGNOSIS — R634 Abnormal weight loss: Secondary | ICD-10-CM | POA: Diagnosis not present

## 2024-09-14 DIAGNOSIS — D509 Iron deficiency anemia, unspecified: Secondary | ICD-10-CM | POA: Diagnosis not present

## 2024-09-14 DIAGNOSIS — K2970 Gastritis, unspecified, without bleeding: Secondary | ICD-10-CM | POA: Diagnosis not present

## 2024-09-14 DIAGNOSIS — K921 Melena: Secondary | ICD-10-CM

## 2024-09-14 DIAGNOSIS — K2961 Other gastritis with bleeding: Secondary | ICD-10-CM | POA: Diagnosis not present

## 2024-09-14 DIAGNOSIS — K5900 Constipation, unspecified: Secondary | ICD-10-CM | POA: Diagnosis not present

## 2024-09-14 DIAGNOSIS — R194 Change in bowel habit: Secondary | ICD-10-CM | POA: Diagnosis not present

## 2024-09-14 MED ORDER — PANTOPRAZOLE SODIUM 20 MG PO TBEC
20.0000 mg | DELAYED_RELEASE_TABLET | Freq: Every day | ORAL | 3 refills | Status: AC
  Filled 2024-09-14: qty 30, 30d supply, fill #0

## 2024-09-14 MED ORDER — PANTOPRAZOLE SODIUM 40 MG PO TBEC
40.0000 mg | DELAYED_RELEASE_TABLET | Freq: Every day | ORAL | 2 refills | Status: AC
  Filled 2024-09-14: qty 30, 30d supply, fill #0
  Filled 2024-10-24: qty 60, 60d supply, fill #1
  Filled 2024-10-24: qty 30, 30d supply, fill #1

## 2024-09-14 MED ORDER — SODIUM CHLORIDE 0.9 % IV SOLN: 500.0000 mL | Freq: Once | INTRAVENOUS | Status: DC

## 2024-09-14 NOTE — Progress Notes (Signed)
 Transferred to PACU via stretcher.  Not responding to stimulation at this time.  VSS upon leaving procedure room.

## 2024-09-14 NOTE — Op Note (Signed)
 [facility] Patient Name: Samantha Trevino Procedure Date: 09/14/2024 3:19 PM MRN: [id number] Endoscopist: Gustav ALONSO Mcgee , MD, [id number] Age: 43 Referring MD:  Date of Birth: 11/16/1980 Gender: Female Account #: [id number] Procedure:                Colonoscopy Indications:              Unexplained iron deficiency anemia Medicines:                Monitored Anesthesia Care Procedure:                Pre-Anesthesia Assessment:                           - Prior to the procedure, a History and Physical                            was performed, and patient medications and                            allergies were reviewed. The patient's tolerance of                            previous anesthesia was also reviewed. The risks                            and benefits of the procedure and the sedation                            options and risks were discussed with the patient.                            All questions were answered, and informed consent                            was obtained. Prior Anticoagulants: The patient has                            taken no anticoagulant or antiplatelet agents. ASA                            Grade Assessment: II - A patient with mild systemic                            disease. After reviewing the risks and benefits,                            the patient was deemed in satisfactory condition to                            undergo the procedure.                           After obtaining informed consent, the colonoscope  was passed under direct vision. Throughout the                            procedure, the patient's blood pressure, pulse, and                            oxygen saturations were monitored continuously. The                            Olympus Scope [id number] was introduced through the                            anus and advanced to the the cecum, identified by                            appendiceal orifice  and ileocecal valve. The                            colonoscopy was performed without difficulty. The                            patient tolerated the procedure well. The quality                            of the bowel preparation was good. The ileocecal                            valve, appendiceal orifice, and rectum were                            photographed. Scope In: 3:44:33 PM Scope Out: 3:55:15 PM Scope Withdrawal Time: 0 hours 6 minutes 40 seconds  Total Procedure Duration: 0 hours 10 minutes 42 seconds  Findings:                 The perianal and digital rectal examinations were                            normal.                           Non-bleeding external and internal hemorrhoids were                            found during retroflexion. The hemorrhoids were                            small.                           The exam was otherwise without abnormality. Complications:            No immediate complications. Estimated Blood Loss:     Estimated blood loss was minimal. Impression:               - Non-bleeding external and internal hemorrhoids.                           -  The examination was otherwise normal.                           - No specimens collected. Recommendation:           - Patient has a contact number available for                            emergencies. The signs and symptoms of potential                            delayed complications were discussed with the                            patient. Return to normal activities tomorrow.                            Written discharge instructions were provided to the                            patient.                           - Resume previous diet.                           - Continue present medications.                           - Repeat colonoscopy in 10 years for surveillance. Ajanay Farve V. Markita Stcharles, MD 09/14/2024 4:01:31 PM This report has been signed electronically.

## 2024-09-14 NOTE — Op Note (Signed)
 [facility] Patient Name: Samantha Trevino Procedure Date: 09/14/2024 3:19 PM MRN: [id number] Endoscopist: Gustav ALONSO Mcgee , MD, [id number] Age: 43 Referring MD:  Date of Birth: 11/16/1980 Gender: Female Account #: [id number] Procedure:                Upper GI endoscopy Indications:              Suspected upper gastrointestinal bleeding in                            patient with unexplained iron deficiency anemia Medicines:                Monitored Anesthesia Care Procedure:                Pre-Anesthesia Assessment:                           - Prior to the procedure, a History and Physical                            was performed, and patient medications and                            allergies were reviewed. The patient's tolerance of                            previous anesthesia was also reviewed. The risks                            and benefits of the procedure and the sedation                            options and risks were discussed with the patient.                            All questions were answered, and informed consent                            was obtained. Prior Anticoagulants: The patient has                            taken no anticoagulant or antiplatelet agents. ASA                            Grade Assessment: II - A patient with mild systemic                            disease. After reviewing the risks and benefits,                            the patient was deemed in satisfactory condition to                            undergo the procedure.  After obtaining informed consent, the endoscope was                            passed under direct vision. Throughout the                            procedure, the patient's blood pressure, pulse, and                            oxygen saturations were monitored continuously. The                            Olympus Scope [id number] was introduced through the                             mouth, and advanced to the second part of duodenum.                            The upper GI endoscopy was accomplished without                            difficulty. The patient tolerated the procedure                            well. Scope In: Scope Out: Findings:                 The Z-line was regular and was found 38 cm from the                            incisors.                           No gross lesions were noted in the entire esophagus.                           Patchy mild inflammation characterized by                            congestion (edema), erythema, friability and                            shallow ulcerations was found in the gastric                            antrum. Biopsies were taken with a cold forceps for                            histology. Biopsies were taken with a cold forceps                            for Helicobacter pylori testing.                           The cardia and gastric fundus were normal  on                            retroflexion.                           The examined duodenum was normal. Complications:            No immediate complications. Estimated Blood Loss:     Estimated blood loss was minimal. Impression:               - Z-line regular, 38 cm from the incisors.                           - No gross lesions in the entire esophagus.                           - Gastritis. Biopsied.                           - Normal examined duodenum. Recommendation:           - Resume previous diet.                           - Continue present medications.                           - Await pathology results.                           - Use Protonix  (pantoprazole ) 40 mg PO daily for 3                            months and then decrease to 20mg  daily.                           - Colace at bedtime as needed to prevent                            constipation Devann Cribb V. Branae Crail, MD 09/14/2024 4:05:03 PM This report has been signed electronically.

## 2024-09-14 NOTE — Progress Notes (Signed)
 [facility] History and Physical   Primary Care Physician:  Knute Thersia Bitters, FNP   Reason for Procedure:  Iron deficiency anemia  Plan:    EGD and colonoscopy with possible interventions as needed     HPI: Samantha Trevino is a very pleasant 43 y.o. female here for EGD and colonoscopy for iron deficiency anemia.  Please refer to office visit note by Delon Failing  The risks and benefits as well as alternatives of endoscopic procedure(s) have been discussed and reviewed.  The patient was provided an opportunity to ask questions and all were answered. The patient agreed with the plan and demonstrated an understanding of the instructions.   Past Medical History:  Diagnosis Date   Alopecia areata    History of MRSA infection    Medical history non-contributory    Varicose veins of both lower extremities    Venous insufficiency     Past Surgical History:  Procedure Laterality Date   DILATION AND CURETTAGE OF UTERUS      Prior to Admission medications   Medication Sig Start Date End Date Taking? Authorizing Provider  COLOSTRUM PO Take by mouth daily.   Yes [provider]  cyanocobalamin  (VITAMIN B12) 1000 MCG/ML injection Inject 1 mL (1,000 mcg total) intramuscular once a week for 4 weeks, THEN inject 1 mL (1,000 mcg total) intramuscularly once a month. 08/10/24  Yes Caudle, Thersia Bitters, FNP  Ferrous Sulfate (IRON SUPPLEMENT PO) Take by mouth.   Yes [provider]  ibuprofen  (ADVIL ) 800 MG tablet Take 1 tablet (800 mg total) by mouth every 8 (eight) hours as needed. 06/30/24  Yes Fleming, Zelda W, NP  MAGNESIUM PO Take by mouth at bedtime.   Yes [provider]  minoxidil  (LONITEN ) 2.5 MG tablet Take 1 tablet (2.5 mg total) by mouth daily. 06/21/24  Yes Caudle, Thersia Bitters, FNP  Na Sulfate-K Sulfate-Mg Sulfate concentrate (SUPREP BOWEL PREP KIT) 17.5-3.13-1.6 GM/177ML SOLN Take 1 kit (354 mLs total) by mouth as directed. For  colonoscopy prep 08/13/24  Yes Failing Delon Readlyn, [place]  Omega-3 Fatty Acids (FISH OIL PO) Take by mouth daily.   Yes [provider]  OVER THE COUNTER MEDICATION Nutrafol   Yes [provider]  clindamycin  (CLINDAGEL ) 1 % gel Apply 1 Application topically 2 (two) times daily. 06/21/24   Caudle, Thersia Bitters, FNP  ketoconazole  (NIZORAL ) 2 % shampoo Apply to scalp twice a week for 8 weeks and then weekly thereafter. Leave on scalp for approximately 3-5 minutes before rinsing. 08/10/24   Caudle, Thersia Bitters, FNP  tretinoin  (RETIN-A ) 0.1 % cream Apply pea sized amount topically to face at bedtime. 06/21/24   Knute Thersia Bitters, FNP    Current Outpatient Medications  Medication Sig Dispense Refill   COLOSTRUM PO Take by mouth daily.     cyanocobalamin  (VITAMIN B12) 1000 MCG/ML injection Inject 1 mL (1,000 mcg total) intramuscular once a week for 4 weeks, THEN inject 1 mL (1,000 mcg total) intramuscularly once a month. 10 mL 1   Ferrous Sulfate (IRON SUPPLEMENT PO) Take by mouth.     ibuprofen  (ADVIL ) 800 MG tablet Take 1 tablet (800 mg total) by mouth every 8 (eight) hours as needed. 30 tablet 0   MAGNESIUM PO Take by mouth at bedtime.     minoxidil  (LONITEN ) 2.5 MG tablet Take 1 tablet (2.5 mg total) by mouth daily. 30 tablet 5   Na Sulfate-K Sulfate-Mg Sulfate concentrate (SUPREP BOWEL PREP KIT) 17.5-3.13-1.6 GM/177ML SOLN Take 1 kit (  354 mLs total) by mouth as directed. For colonoscopy prep 354 mL 0   Omega-3 Fatty Acids (FISH OIL PO) Take by mouth daily.     OVER THE COUNTER MEDICATION Nutrafol     clindamycin  (CLINDAGEL ) 1 % gel Apply 1 Application topically 2 (two) times daily. 30 g 5   ketoconazole  (NIZORAL ) 2 % shampoo Apply to scalp twice a week for 8 weeks and then weekly thereafter. Leave on scalp for approximately 3-5 minutes before rinsing. 120 mL 5   tretinoin  (RETIN-A ) 0.1 % cream Apply pea sized amount topically to face at bedtime. 45 g 3   Current  Facility-Administered Medications  Medication Dose Route Frequency Provider Last Rate Last Admin   0.9 %  sodium chloride  infusion  500 mL Intravenous Once Kayleeann Huxford V, MD        Allergies as of 09/14/2024   (No Known Allergies)    Family History  Problem Relation Age of Onset   Diabetes Mother    Thyroid  disease Mother    Other Mother        trigeminal neuralgia   Hypertension Father    Heart disease Father    Diabetes Father    Hyperlipidemia Father    Stroke Father    Healthy Sister    High Cholesterol Brother    Varicose Veins Maternal Aunt    Early death Maternal Grandmother    Early death Maternal Grandfather    Hearing loss Paternal Actor    Healthy Daughter    Healthy Son    Colon cancer Neg Hx    Esophageal cancer Neg Hx    Stomach cancer Neg Hx    Rectal cancer Neg Hx     Social History   Socioeconomic History   Marital status: Married    Spouse name: Not on file   Number of children: 2   Years of education: Not on file   Highest education level: Doctorate  Occupational History   Not on file  Tobacco Use   Smoking status: Never    Passive exposure: Never   Smokeless tobacco: Never  Vaping Use   Vaping status: Never Used  Substance and Sexual Activity   Alcohol use: Yes    Comment: occ   Drug use: No   Sexual activity: Yes    Birth control/protection: None  Other Topics Concern   Not on file  Social History Narrative   Not on file   Social Drivers of Health   Financial Resource Strain: Low Risk  (06/20/2024)   Overall Financial Resource Strain (CARDIA)    Difficulty of Paying Living Expenses: Not hard at all  Food Insecurity: No Food Insecurity (06/20/2024)   Hunger Vital Sign    Worried About Running Out of Food in the Last Year: Never true    Ran Out of Food in the Last Year: Never true  Transportation Needs: No Transportation Needs (06/20/2024)   PRAPARE - Administrator, Civil Service (Medical): No    Lack of  Transportation (Non-Medical): No  Physical Activity: Insufficiently Active (06/20/2024)   Exercise Vital Sign    Days of Exercise per Week: 4 days    Minutes of Exercise per Session: 30 min  Stress: No Stress Concern Present (06/20/2024)   [employer] of Occupational Health - Occupational Stress Questionnaire    Feeling of Stress: Not at all  Social Connections: Socially Integrated (06/20/2024)   Social Connection and Isolation Panel    Frequency of Communication with  Friends and Family: More than three times a week    Frequency of Social Gatherings with Friends and Family: More than three times a week    Attends Religious Services: More than 4 times per year    Active Member of [employer] or Organizations: Yes    Attends Engineer, Structural: More than 4 times per year    Marital Status: Married  Catering Manager Violence: Not on file    Review of Systems:  All other review of systems negative except as mentioned in the HPI.  Physical Exam: Vital signs in last 24 hours: BP 106/72   Pulse 78   Temp 98.4 F (36.9 C) (Temporal)   Ht 5' 2 (1.575 m)   Wt 108 lb (49 kg)   LMP 08/16/2024 (Exact Date)   SpO2 100%   BMI 19.75 kg/m  General:   Alert, NAD Lungs:  Clear .   Heart:  Regular rate and rhythm Abdomen:  Soft, nontender and nondistended. Neuro/Psych:  Alert and cooperative. Normal mood and affect. A and O x 3  Reviewed labs, radiology imaging, old records and pertinent past GI work up  Patient is appropriate for planned procedure(s) and anesthesia in an ambulatory setting   K. Veena Samiksha Pellicano , MD [phone]

## 2024-09-14 NOTE — Progress Notes (Signed)
 Called to room to assist during endoscopic procedure.  Patient ID and intended procedure confirmed with present staff. Received instructions for my participation in the procedure from the performing physician.

## 2024-09-14 NOTE — Patient Instructions (Addendum)
 Continue present medications. Await pathology results. Use Protonix  (pantoprazole ) 40 mg PO daily for 3 months and then decrease to 20mg  daily.  Take tablet 30 minutes before first meal of the day Colace at bedtime as needed to prevent constipation  Continue present medications. Repeat colonoscopy in 10 years for surveillance.  YOU HAD AN ENDOSCOPIC PROCEDURE TODAY AT THE [facility]:   Refer to the procedure report that was given to you for any specific questions about what was found during the examination.  If the procedure report does not answer your questions, please call your gastroenterologist to clarify.  If you requested that your care partner not be given the details of your procedure findings, then the procedure report has been included in a sealed envelope for you to review at your convenience later.  YOU SHOULD EXPECT: Some feelings of bloating in the abdomen. Passage of more gas than usual.  Walking can help get rid of the air that was put into your GI tract during the procedure and reduce the bloating. If you had a lower endoscopy (such as a colonoscopy or flexible sigmoidoscopy) you may notice spotting of blood in your stool or on the toilet paper. If you underwent a bowel prep for your procedure, you may not have a normal bowel movement for a few days.  Please Note:  You might notice some irritation and congestion in your nose or some drainage.  This is from the oxygen used during your procedure.  There is no need for concern and it should clear up in a day or so.  SYMPTOMS TO REPORT IMMEDIATELY:  Following lower endoscopy (colonoscopy or flexible sigmoidoscopy):  Excessive amounts of blood in the stool  Significant tenderness or worsening of abdominal pains  Swelling of the abdomen that is new, acute  Fever of 100F or higher  Following upper endoscopy (EGD)  Vomiting of blood or coffee ground material  New chest pain or pain under the shoulder blades  Painful  or persistently difficult swallowing  New shortness of breath  Fever of 100F or higher  Black, tarry-looking stools  For urgent or emergent issues, a gastroenterologist can be reached at any hour by calling [phone]. Do not use MyChart messaging for urgent concerns.    DIET:  We do recommend a small meal at first, but then you may proceed to your regular diet.  Drink plenty of fluids but you should avoid alcoholic beverages for 24 hours.  ACTIVITY:  You should plan to take it easy for the rest of today and you should NOT DRIVE or use heavy machinery until tomorrow (because of the sedation medicines used during the test).    FOLLOW UP: Our staff will call the number listed on your records the next business day following your procedure.  We will call around 7:15- 8:00 am to check on you and address any questions or concerns that you may have regarding the information given to you following your procedure. If we do not reach you, we will leave a message.     If any biopsies were taken you will be contacted by phone or by letter within the next 1-3 weeks.  Please call us  at [phone] if you have not heard about the biopsies in 3 weeks.    SIGNATURES/CONFIDENTIALITY: You and/or your care partner have signed paperwork which will be entered into your electronic medical record.  These signatures attest to the fact that that the information above on your After Visit Summary  has been reviewed and is understood.  Full responsibility of the confidentiality of this discharge information lies with you and/or your care-partner.

## 2024-09-14 NOTE — Progress Notes (Signed)
 Pt's states no medical or surgical changes since previsit or office visit.

## 2024-09-17 ENCOUNTER — Telehealth: Payer: Self-pay | Admitting: *Deleted

## 2024-09-17 ENCOUNTER — Ambulatory Visit: Attending: Family Medicine | Admitting: Audiologist

## 2024-09-17 DIAGNOSIS — H9193 Unspecified hearing loss, bilateral: Secondary | ICD-10-CM | POA: Insufficient documentation

## 2024-09-17 NOTE — Procedures (Signed)
 Outpatient Audiology and [facility] [address] [place], [place]  [address] [phone]  AUDIOLOGICAL  EVALUATION  NAME: Samantha Trevino     DOB:   August 28, 1981      MRN: [id number]                                                                                     DATE: 09/17/2024     REFERENT: Knute Thersia Bitters, FNP STATUS: Outpatient DIAGNOSIS: Decreased Hearing    History: [name] was seen for an audiological evaluation due to difficulty hearing at work in noisy situations. 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 She is asking people to repeat. She is overwhelmed by the noise of her children at home. This has been going on for a while. She has never had a hearing test before. She has a relative who is deaf in one ear. She speaks three different languages. She grew up in [place]. Her uncle would remove wax from her ears with the back of a safety pin. She never had tubes.  [name] denies pain, pressure, or tinnitus.  [name] has no history of hazardous noise exposure.  Medical history shows no additional risk for hearing loss.    Evaluation:  Otoscopy showed a clear view of the tympanic membranes, bilaterally. Slight scarring on TM of left ear.  Tympanometry results were consistent with normal middle ear function, bilaterally   Audiometric testing was completed using Conventional Audiometry techniques with insert earphones and supraural headphones. Test results are consistent with normal hearing bilaterally. Speech Recognition Thresholds were obtained at  20dB HL in the right ear and at 15dB HL in the left ear. Word Recognition Testing was completed at  40dB SL and [name] scored 100% in each ear.  Quick Speech in Noise Test (QuickSIN):  list of six sentences with five key words per sentence is presented in four-talker babble noise. The sentences are presented at pre- recorded signal-to-noise ratios which decrease in 5-dB steps from 25 (very easy) to 0  (extremely difficult). The SNRs used are: 25, 20, 15, 10, 5 and 0, encompassing normal to severely impaired performance in noise. [name] scores 3.5dB SNR Loss, normal is 0-3dB.  [name] exhibits difficulty hearing in noise in mild range. This is likely exacerbated by fatigue and english was not her first language.   Results:  The test results were reviewed with [name]. [name] has normal hearing. She likely is struggling to hear in noise due to fatigue and use of several languages at home vs english at work. If she is really struggling she can use an OTC aid. She is not struggling on a daily basis.   Audiogram printed and provided to [name].    Recommendations: Over the counter hearing aids recommended for both ears to help when tired or in noise. Patient given list of recommended devices.  No further testing is recommended at this time.         38 minutes spent testing and counseling on results.   If you have any questions please feel free to contact me at [phone].  Lauraine Ka Stalnaker Au.D.  Audiologist   09/17/2024  8:45  AM  Cc: Knute Thersia Bitters, FNP

## 2024-09-17 NOTE — Telephone Encounter (Signed)
  Follow up Call-     09/14/2024    2:20 PM  Call back number  Post procedure Call Back phone  # [phone]  Permission to leave phone message Yes     Patient questions:  Do you have a fever, pain , or abdominal swelling? No. Pain Score  0 *  Have you tolerated food without any problems? Yes.    Have you been able to return to your normal activities? Yes.    Do you have any questions about your discharge instructions: Diet   No. Medications  No. Follow up visit  No.  Do you have questions or concerns about your Care? No.  Actions: * If pain score is 4 or above: No action needed, pain <4.

## 2024-09-18 ENCOUNTER — Other Ambulatory Visit (HOSPITAL_BASED_OUTPATIENT_CLINIC_OR_DEPARTMENT_OTHER): Payer: Self-pay

## 2024-09-19 ENCOUNTER — Telehealth: Payer: Self-pay

## 2024-09-19 LAB — SURGICAL PATHOLOGY

## 2024-10-04 ENCOUNTER — Other Ambulatory Visit (HOSPITAL_BASED_OUTPATIENT_CLINIC_OR_DEPARTMENT_OTHER): Payer: Self-pay

## 2024-10-15 ENCOUNTER — Ambulatory Visit: Payer: Self-pay | Admitting: Gastroenterology

## 2024-10-24 ENCOUNTER — Other Ambulatory Visit (HOSPITAL_BASED_OUTPATIENT_CLINIC_OR_DEPARTMENT_OTHER): Payer: Self-pay

## 2024-10-24 ENCOUNTER — Ambulatory Visit: Admitting: Physician Assistant

## 2024-10-24 ENCOUNTER — Other Ambulatory Visit: Payer: Self-pay

## 2024-11-01 ENCOUNTER — Encounter: Admitting: Rheumatology

## 2024-12-05 ENCOUNTER — Ambulatory Visit: Admitting: Rheumatology

## 2025-02-04 ENCOUNTER — Ambulatory Visit: Admitting: Rheumatology

## 2025-02-06 ENCOUNTER — Ambulatory Visit: Admitting: Physician Assistant
